# Patient Record
Sex: Female | Born: 1985 | Race: Black or African American | Hispanic: No | Marital: Married | Smoking: Never smoker
Health system: Southern US, Community
[De-identification: ages and names within clinical notes are randomized; demographics above are authoritative.]

## PROBLEM LIST (undated history)

## (undated) ENCOUNTER — Inpatient Hospital Stay: Payer: Self-pay

## (undated) DIAGNOSIS — R87810 Cervical high risk human papillomavirus (HPV) DNA test positive: Secondary | ICD-10-CM

## (undated) DIAGNOSIS — D696 Thrombocytopenia, unspecified: Secondary | ICD-10-CM

## (undated) DIAGNOSIS — D270 Benign neoplasm of right ovary: Secondary | ICD-10-CM

## (undated) DIAGNOSIS — N6019 Diffuse cystic mastopathy of unspecified breast: Secondary | ICD-10-CM

## (undated) DIAGNOSIS — Z8719 Personal history of other diseases of the digestive system: Secondary | ICD-10-CM

## (undated) DIAGNOSIS — O99119 Other diseases of the blood and blood-forming organs and certain disorders involving the immune mechanism complicating pregnancy, unspecified trimester: Secondary | ICD-10-CM

## (undated) HISTORY — PX: TONSILLECTOMY: SUR1361

## (undated) HISTORY — DX: Diffuse cystic mastopathy of unspecified breast: N60.19

## (undated) HISTORY — DX: Personal history of other diseases of the digestive system: Z87.19

---

## 2014-09-20 DIAGNOSIS — D696 Thrombocytopenia, unspecified: Secondary | ICD-10-CM | POA: Insufficient documentation

## 2014-10-31 DIAGNOSIS — O99119 Other diseases of the blood and blood-forming organs and certain disorders involving the immune mechanism complicating pregnancy, unspecified trimester: Secondary | ICD-10-CM

## 2014-12-19 DIAGNOSIS — R87810 Cervical high risk human papillomavirus (HPV) DNA test positive: Secondary | ICD-10-CM

## 2014-12-19 HISTORY — DX: Cervical high risk human papillomavirus (HPV) DNA test positive: R87.810

## 2015-05-15 DIAGNOSIS — A630 Anogenital (venereal) warts: Secondary | ICD-10-CM | POA: Insufficient documentation

## 2015-08-11 DIAGNOSIS — D270 Benign neoplasm of right ovary: Secondary | ICD-10-CM

## 2015-08-11 HISTORY — DX: Benign neoplasm of right ovary: D27.0

## 2015-11-28 DIAGNOSIS — O99810 Abnormal glucose complicating pregnancy: Secondary | ICD-10-CM | POA: Insufficient documentation

## 2016-04-19 NOTE — L&D Delivery Note (Signed)
Delivery Note At 7:22 PM a viable and healthy female - as yet unnamed - was delivered via Vaginal, Spontaneous (Presentation: LOA;  ).  APGAR:8 ,9 ; weight  pending.   Placenta status: spontaneous, intact.  Cord: 3VC with the following complications: none.  Anesthesia:  epidural Episiotomy: None Lacerations: None Est. Blood Loss (mL):  1000, quantitative blood loss pending  Mom to postpartum.  Baby to Couplet care / Skin to Skin.  31yo [id number] at 40+1wks admitted for iol with concern for macrosomia, with a hx of a 10#14oz first baby by vaginal delivery without shoulder dystocia. She was 3cm on arrival and pitocin was started. After 12hrs without cervical change but frequent contractions, AROM for clear fluid. She progressed rapidly to fully dilated and pushed easily, delivering fetal head in an LOA position. Fetal shoulders followed promptly, and the baby was placed on the maternal abdomen. Delayed cord clamping x 60sec, but a large gush of bright red bleeding followed. The cord was quickly doubling clamped and cut and the placenta delivered spontaneously promptly, followed by continued bright red bleeding. Fundal massage and bimanual compression of the LUS resulted in resolved bleeding. After iodine cleaning of uretheral opening, the bladder was drained for about 117ml of clear yellow urine, and thorough examination of the vagina and cervix revealed no lacerations. 0.2mg  of IM methergine was given after confirming blood pressure. Bimanual confirmed small clots in LUS and firm fundus, and bleeding returned to minimal.   Baby to breast currently. Both mom and baby tolerated the procedure well. Will continue to monitor per protocol for continued bleeding  Benjaman Kindler 03/03/2017, 7:44 PM

## 2016-07-06 ENCOUNTER — Ambulatory Visit (INDEPENDENT_AMBULATORY_CARE_PROVIDER_SITE_OTHER): Payer: Managed Care, Other (non HMO) | Admitting: Obstetrics and Gynecology

## 2016-07-06 ENCOUNTER — Encounter: Payer: Self-pay | Admitting: Obstetrics and Gynecology

## 2016-07-06 VITALS — BP 150/84 | HR 76 | Ht 70.0 in | Wt 214.4 lb

## 2016-07-06 DIAGNOSIS — N926 Irregular menstruation, unspecified: Secondary | ICD-10-CM

## 2016-07-06 DIAGNOSIS — O09891 Supervision of other high risk pregnancies, first trimester: Secondary | ICD-10-CM | POA: Diagnosis not present

## 2016-07-06 DIAGNOSIS — O09299 Supervision of pregnancy with other poor reproductive or obstetric history, unspecified trimester: Secondary | ICD-10-CM

## 2016-07-06 LAB — POCT URINE PREGNANCY: Preg Test, Ur: POSITIVE — AB

## 2016-07-06 NOTE — Progress Notes (Signed)
HPI:      Ms. Jessica Cantu is a 31 y.o. [id number] who LMP was Patient's last menstrual period was 05/26/2016 (exact date).  Subjective:   She presents today After missing her menstrual period. She has had home positive pregnancy tests. She recently had her second pregnancy and was breast-feeding. She was not preventing pregnancy. She is taking prenatal vitamins. Her previous pregnancies were relatively uncomplicated. She has a proven pelvis with a previous pregnancy with weight greater than 10 pounds. She reports that she has never been diagnosed as a gestational diabetic.    Hx: The following portions of the patient's history were reviewed and updated as appropriate:              She  has no past medical history on file. She  does not have a problem list on file. She  has a past surgical history that includes Tonsillectomy. Her family history includes Cancer in her paternal grandfather; Diabetes in her maternal grandfather, maternal grandmother, paternal grandfather, and paternal grandmother; Hypertension in her father and maternal grandmother. She  reports that she has never smoked. She has never used smokeless tobacco. She reports that she drinks alcohol. She reports that she does not use drugs. No current outpatient prescriptions on file prior to visit.   No current facility-administered medications on file prior to visit.          Review of Systems:  Review of Systems  Constitutional: Denied constitutional symptoms, night sweats, recent illness, fatigue, fever, insomnia and weight loss.  Eyes: Denied eye symptoms, eye pain, photophobia, vision change and visual disturbance.  Ears/Nose/Throat/Neck: Denied ear, nose, throat or neck symptoms, hearing loss, nasal discharge, sinus congestion and sore throat.  Cardiovascular: Denied cardiovascular symptoms, arrhythmia, chest pain/pressure, edema, exercise intolerance, orthopnea and palpitations.  Respiratory: Denied pulmonary symptoms,  asthma, pleuritic pain, productive sputum, cough, dyspnea and wheezing.  Gastrointestinal: Denied, gastro-esophageal reflux, melena, nausea and vomiting.  Genitourinary: Denied genitourinary symptoms including symptomatic vaginal discharge, pelvic relaxation issues, and urinary complaints.  Musculoskeletal: Denied musculoskeletal symptoms, stiffness, swelling, muscle weakness and myalgia.  Dermatologic: Denied dermatology symptoms, rash and scar.  Neurologic: Denied neurology symptoms, dizziness, headache, neck pain and syncope.  Psychiatric: Denied psychiatric symptoms, anxiety and depression.  Endocrine: Denied endocrine symptoms including hot flashes and night sweats.   Meds:   No current outpatient prescriptions on file prior to visit.   No current facility-administered medications on file prior to visit.     Objective:     Vitals:   07/06/16 1341  BP: (!) 150/84  Pulse: 76                 Pelvic:   Vulva: Normal appearance.  No lesions.  Vagina: No lesions or abnormalities noted.  Support: Normal pelvic support.  Urethra No masses tenderness or scarring.  Meatus Normal size without lesions or prolapse.  Cervix: Normal appearance.  No lesions.  Anus: Normal exam.  No lesions.  Perineum: Normal exam.  No lesions.        Bimanual   Adnexae: No masses.  Non-tender to palpation.  Uterus: Minimal enlargement  Non-tender.  Mobile.  AV.  Adnexae: No masses.  Non-tender to palpation.  Cul-de-sac: Negative for abnormality.  Adnexae: No masses.  Non-tender to palpation.         Pelvimetry   Diagonal: Reached.  Spines: Average.  Sacrum: Concave.  Pubic Arch: Normal.      Assessment:    G3P2002 There are no active problems  to display for this patient.    1. Missed menses   2. Short interval between pregnancies affecting pregnancy in first trimester, antepartum   3. History of macrosomia in infant in prior pregnancy, currently pregnant     Dates are important  because of the breast-feeding and previous history of macrosomia.   Plan:            Prenatal Plan 1.  The patient was given prenatal literature. 2.  She was continued on prenatal vitamins. 3.  A prenatal lab panel was ordered or drawn. 4.  An ultrasound was ordered to better determine an EDC. 5.  Early 1 hour GCT (patient desires alternate form of sugar load) 6.  Pap done  Orders Orders Placed This Encounter  Procedures  . US OB Transvaginal  . POCT urine pregnancy     Meds ordered this encounter  Medications  . Prenatal Vit-Fe Fumarate-FA (PRENATAL MULTIVITAMIN) TABS tablet    Sig: Take 1 tablet by mouth daily at 12 noon.        F/U  Return in about 5 weeks (around 08/10/2016).  Finis Bud, M.D. 07/06/2016 2:42 PM

## 2016-07-08 LAB — PAP IG AND HPV HIGH-RISK
HPV, HIGH-RISK: POSITIVE — AB
PAP Smear Comment: 0

## 2016-07-12 ENCOUNTER — Telehealth: Payer: Self-pay

## 2016-07-12 NOTE — Telephone Encounter (Signed)
-----   Message from Harlin Heys, MD sent at 07/12/2016  8:36 AM EDT ----- NEG - cytology:  POS - HPV - discuss at next visit

## 2016-07-12 NOTE — Telephone Encounter (Signed)
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

## 2016-07-13 NOTE — Telephone Encounter (Signed)
Pt has appointment with provider 07/26/16.

## 2016-07-26 ENCOUNTER — Ambulatory Visit (INDEPENDENT_AMBULATORY_CARE_PROVIDER_SITE_OTHER): Payer: Managed Care, Other (non HMO)

## 2016-07-26 ENCOUNTER — Ambulatory Visit (INDEPENDENT_AMBULATORY_CARE_PROVIDER_SITE_OTHER): Payer: Managed Care, Other (non HMO) | Admitting: Obstetrics and Gynecology

## 2016-07-26 VITALS — BP 137/83 | HR 79 | Ht 70.0 in | Wt 215.6 lb

## 2016-07-26 DIAGNOSIS — Z3481 Encounter for supervision of other normal pregnancy, first trimester: Secondary | ICD-10-CM

## 2016-07-26 DIAGNOSIS — N926 Irregular menstruation, unspecified: Secondary | ICD-10-CM

## 2016-07-26 DIAGNOSIS — R638 Other symptoms and signs concerning food and fluid intake: Secondary | ICD-10-CM

## 2016-07-26 DIAGNOSIS — Z1389 Encounter for screening for other disorder: Secondary | ICD-10-CM

## 2016-07-26 DIAGNOSIS — Z113 Encounter for screening for infections with a predominantly sexual mode of transmission: Secondary | ICD-10-CM

## 2016-07-26 NOTE — Progress Notes (Signed)
Milus Glazier presents for NOB nurse interview visit. Pregnancy confirmation done 07/06/2016 by Dr. Amalia Hailey. UPT: positive. Ultrasound done 07/26/2016 results: viable fetus, 8.5wks which is consistent with LMP: 05/26/2016. Also noted dermoid cyst that has been seen on ultrasound before.  G-3.  P-2002. Pregnancy education material explained and given. No cats in the home. NOB labs ordered. TSH/HbgA1c due to Increased BMI. HIV labs and Drug screen were explained optional and she did not decline. Drug screen ordered. PNV encouraged. Genetic screening options discussed. Genetic testing: Declined.  Pt may discuss with provider if indicated.  Pt. To follow up with provider 08/17/2016 with Dr. Marcelline Mates  for NOB physical.  All questions answered.

## 2016-07-26 NOTE — Patient Instructions (Signed)
Pregnancy and Zika Virus Disease Zika virus disease, or Zika, is an illness that can spread to people from mosquitoes that carry the virus. It may also spread from person to person through infected body fluids. Zika first occurred in [place], but recently it has spread to new areas. The virus occurs in tropical climates. The location of Zika continues to change. Most people who become infected with Zika virus do not develop serious illness. However, Zika may cause birth defects in an unborn baby whose mother is infected with the virus. It may also increase the risk of miscarriage. What are the symptoms of Zika virus disease? In many cases, people who have been infected with Zika virus do not develop any symptoms. If symptoms appear, they usually start about a week after the person is infected. Symptoms are usually mild. They may include:  Fever.  Rash.  Red eyes.  Joint pain. How does Zika virus disease spread? The main way that [place] virus spreads is through the bite of a certain type of mosquito. Unlike most types of mosquitos, which bite only at night, the type of mosquito that carries Zika virus bites both at night and during the day. Zika virus can also spread through sexual contact, through a blood transfusion, and from a mother to her baby before or during birth. Once you have had Zika virus disease, it is unlikely that you will get it again. Can I pass Zika to my baby during pregnancy? Yes, Zika can pass from a mother to her baby before or during birth. What problems can Zika cause for my baby? A woman who is infected with Zika virus while pregnant is at risk of having her baby born with a condition in which the brain or head is smaller than expected (microcephaly). Babies who have microcephaly can have developmental delays, seizures, hearing problems, and vision problems. Having Zika virus disease during pregnancy can also increase the risk of miscarriage. How can Zika virus disease be  prevented? There is no vaccine to prevent Zika. The best way to prevent the disease is to avoid infected mosquitoes and avoid exposure to body fluids that can spread the virus. Avoid any possible exposure to [place] by taking the following precautions. For women and their sex partners:  Avoid traveling to high-risk areas. The locations where [place] is being reported change often. To identify high-risk areas, check the CDC travel website: CreditChaos.com.ee  If you or your sex partner must travel to a high-risk area, talk with a health care provider before and after traveling.  Take all precautions to avoid mosquito bites if you live in, or travel to, any of the high-risk areas. Insect repellents are safe to use during pregnancy.  Ask your health care provider when it is safe to have sexual contact. For women:  If you are pregnant or trying to become pregnant, avoid sexual contact with persons who may have been exposed to [place] virus, persons who have possible symptoms of Zika, or persons whose history you are unsure about. If you choose to have sexual contact with someone who may have been exposed to [place] virus, use condoms correctly during the entire duration of sexual activity, every time. Do not share sexual devices, as you may be exposed to body fluids.  Ask your health care provider about when it is safe to attempt pregnancy after a possible exposure to [place] virus. What steps should I take to avoid mosquito bites? Take these steps to avoid mosquito bites when you are  in a high-risk area:  Wear loose clothing that covers your arms and legs.  Limit your outdoor activities.  Do not open windows unless they have window screens.  Sleep under mosquito nets.  Use insect repellent. The best insect repellents have:  DEET, picaridin, oil of lemon eucalyptus (OLE), or [id number] in them.  Higher amounts of an active ingredient in them.  Remember that insect repellents are safe to use  during pregnancy.  Do not use OLE on children who are younger than 59 years of age. Do not use insect repellent on babies who are younger than 57 months of age.  Cover your child's stroller with mosquito netting. Make sure the netting fits snugly and that any loose netting does not cover your child's mouth or nose. Do not use a blanket as a mosquito-protection cover.  Do not apply insect repellent underneath clothing.  If you are using sunscreen, apply the sunscreen before applying the insect repellent.  Treat clothing with permethrin. Do not apply permethrin directly to your skin. Follow label directions for safe use.  Get rid of standing water, where mosquitoes may reproduce. Standing water is often found in items such as buckets, bowls, animal food dishes, and flowerpots. When you return from traveling to any high-risk area, continue taking actions to protect yourself against mosquito bites for 3 weeks, even if you show no signs of illness. This will prevent spreading Zika virus to uninfected mosquitoes. What should I know about the sexual transmission of Zika? People can spread Zika to their sexual partners during vaginal, anal, or oral sex, or by sharing sexual devices. Many people with [place] do not develop symptoms, so a person could spread the disease without knowing that they are infected. The greatest risk is to women who are pregnant or who may become pregnant. Zika virus can live longer in semen than it can live in blood. Couples can prevent sexual transmission of the virus by:  Using condoms correctly during the entire duration of sexual activity, every time. This includes vaginal, anal, and oral sex.  Not sharing sexual devices. Sharing increases your risk of being exposed to body fluid from another person.  Avoiding all sexual activity until your health care provider says it is safe. Should I be tested for Zika virus? A sample of your blood can be tested for Zika virus. A pregnant  woman should be tested if she may have been exposed to the virus or if she has symptoms of Zika. She may also have additional tests done during her pregnancy, such ultrasound testing. Talk with your health care provider about which tests are recommended. This information is not intended to replace advice given to you by your health care provider. Make sure you discuss any questions you have with your health care provider. Document Released: 12/25/2014 Document Revised: 09/11/2015 Document Reviewed: 12/18/2014 Elsevier Interactive Patient Education  2017 [place] and Medications in Pregnancy  Cold/Flu:  Sudafed for congestion- Robitussin (plain) for cough- Tylenol for discomfort.  Please follow the directions on the label.  Try not to take any more than needed.  OTC Saline nasal spray and air humidifier or cool-mist  Vaporizer to sooth nasal irritation and to loosen congestion.  It is also important to increase intake of non carbonated fluids, especially if you have a fever.  Constipation:  Colace-2 capsules at bedtime; Metamucil- follow directions on label; Senokot- 1 tablet at bedtime.  Any one of these medications can be used.  It is also  very important to increase fluids and fruits along with regular exercise.  If problem persists please call the office.  Diarrhea:  Kaopectate as directed on the label.  Eat a bland diet and increase fluids.  Avoid highly seasoned foods.  Headache:  Tylenol 1 or 2 tablets every 3-4 hours as needed  Indigestion:  Maalox, Mylanta, Tums or Rolaids- as directed on label.  Also try to eat small meals and avoid fatty, greasy or spicy foods.  Nausea with or without Vomiting:  Nausea in pregnancy is caused by increased levels of hormones in the body which influence the digestive system and cause irritation when stomach acids accumulate.  Symptoms usually subside after 1st trimester of pregnancy.  Try the following: 1. Keep saltines, graham crackers  or dry toast by your bed to eat upon awakening. 2. Don't let your stomach get empty.  Try to eat 5-6 small meals per day instead of 3 large ones. 3. Avoid greasy fatty or highly seasoned foods.  4. Take OTC Unisom 1 tablet at bed time along with OTC Vitamin B6 25-50 mg 3 times per day.    If nausea continues with vomiting and you are unable to keep down food and fluids you may need a prescription medication.  Please notify your provider.   Sore throat:  Chloraseptic spray, throat lozenges and or plain Tylenol.  Vaginal Yeast Infection:  OTC Monistat for 7 days as directed on label.  If symptoms do not resolve within a week notify provider.  If any of the above problems do not subside with recommended treatment please call the office for further assistance.   Do not take Aspirin, Advil, Motrin or Ibuprofen.  * * OTC= Over the counter Hyperemesis Gravidarum Hyperemesis gravidarum is a severe form of nausea and vomiting that happens during pregnancy. Hyperemesis is worse than morning sickness. It may cause you to have nausea or vomiting all day for many days. It may keep you from eating and drinking enough food and liquids. Hyperemesis usually occurs during the first half (the first 20 weeks) of pregnancy. It often goes away once a woman is in her second half of pregnancy. However, sometimes hyperemesis continues through an entire pregnancy. What are the causes? The cause of this condition is not known. It may be related to changes in chemicals (hormones) in the body during pregnancy, such as the high level of pregnancy hormone (human chorionic gonadotropin) or the increase in the female sex hormone (estrogen). What are the signs or symptoms? Symptoms of this condition include:  Severe nausea and vomiting.  Nausea that does not go away.  Vomiting that does not allow you to keep any food down.  Weight loss.  Body fluid loss (dehydration).  Having no desire to eat, or not liking food that you  have previously enjoyed. How is this diagnosed? This condition may be diagnosed based on:  A physical exam.  Your medical history.  Your symptoms.  Blood tests.  Urine tests. How is this treated? This condition may be managed with medicine. If medicines to do not help relieve nausea and vomiting, you may need to receive fluids through an IV tube at the hospital. Follow these instructions at home:  Take over-the-counter and prescription medicines only as told by your health care provider.  Avoid iron pills and multivitamins that contain iron for the first 3-4 months of pregnancy. If you take prescription iron pills, do not stop taking them unless your health care provider approves.  Take the  following actions to help prevent nausea and vomiting:  In the morning, before getting out of bed, try eating a couple of dry crackers or a piece of toast.  Avoid foods and smells that upset your stomach. Fatty and spicy foods may make nausea worse.  Eat 5-6 small meals a day.  Do not drink fluids while eating meals. Drink between meals.  Eat or suck on things that have ginger in them. Ginger can help relieve nausea.  Avoid food preparation. The smell of food can spoil your appetite or trigger nausea.  Follow instructions from your health care provider about eating or drinking restrictions.  For snacks, eat high-protein foods, such as cheese.  Keep all follow-up and pre-birth (prenatal) visits as told by your health care provider. This is important. Contact a health care provider if:  You have pain in your abdomen.  You have a severe headache.  You have vision problems.  You are losing weight. Get help right away if:  You cannot drink fluids without vomiting.  You vomit blood.  You have constant nausea and vomiting.  You are very weak.  You are very thirsty.  You feel dizzy.  You faint.  You have a fever or other symptoms that last for more than 2-3 days.  You  have a fever and your symptoms suddenly get worse. Summary  Hyperemesis gravidarum is a severe form of nausea and vomiting that happens during pregnancy.  Making some changes to your eating habits may help relieve nausea and vomiting.  This condition may be managed with medicine.  If medicines to do not help relieve nausea and vomiting, you may need to receive fluids through an IV tube at the hospital. This information is not intended to replace advice given to you by your health care provider. Make sure you discuss any questions you have with your health care provider. Document Released: 04/05/2005 Document Revised: 12/03/2015 Document Reviewed: 12/03/2015 Elsevier Interactive Patient Education  2017 [place] of Pregnancy The first trimester of pregnancy is from week 1 until the end of week 13 (months 1 through 3). During this time, your baby will begin to develop inside you. At 6-8 weeks, the eyes and face are formed, and the heartbeat can be seen on ultrasound. At the end of 12 weeks, all the baby's organs are formed. Prenatal care is all the medical care you receive before the birth of your baby. Make sure you get good prenatal care and follow all of your doctor's instructions. Follow these instructions at home: Medicines   Take over-the-counter and prescription medicines only as told by your doctor. Some medicines are safe and some medicines are not safe during pregnancy.  Take a prenatal vitamin that contains at least 600 micrograms (mcg) of folic acid.  If you have trouble pooping (constipation), take medicine that will make your stool soft (stool softener) if your doctor approves. Eating and drinking   Eat regular, healthy meals.  Your doctor will tell you the amount of weight gain that is right for you.  Avoid raw meat and uncooked cheese.  If you feel sick to your stomach (nauseous) or throw up (vomit):  Eat 4 or 5 small meals a day instead of 3 large  meals.  Try eating a few soda crackers.  Drink liquids between meals instead of during meals.  To prevent constipation:  Eat foods that are high in fiber, like fresh fruits and vegetables, whole grains, and beans.  Drink enough fluids to  keep your pee (urine) clear or pale yellow. Activity   Exercise only as told by your doctor. Stop exercising if you have cramps or pain in your lower belly (abdomen) or low back.  Do not exercise if it is too hot, too humid, or if you are in a place of great height (high altitude).  Try to avoid standing for long periods of time. Move your legs often if you must stand in one place for a long time.  Avoid heavy lifting.  Wear low-heeled shoes. Sit and stand up straight.  You can have sex unless your doctor tells you not to. Relieving pain and discomfort   Wear a good support bra if your breasts are sore.  Take warm water baths (sitz baths) to soothe pain or discomfort caused by hemorrhoids. Use hemorrhoid cream if your doctor says it is okay.  Rest with your legs raised if you have leg cramps or low back pain.  If you have puffy, bulging veins (varicose veins) in your legs:  Wear support hose or compression stockings as told by your doctor.  Raise (elevate) your feet for 15 minutes, 3-4 times a day.  Limit salt in your food. Prenatal care   Schedule your prenatal visits by the twelfth week of pregnancy.  Write down your questions. Take them to your prenatal visits.  Keep all your prenatal visits as told by your doctor. This is important. Safety   Wear your seat belt at all times when driving.  Make a list of emergency phone numbers. The list should include numbers for family, friends, the hospital, and police and fire departments. General instructions   Ask your doctor for a referral to a local prenatal class. Begin classes no later than at the start of month 6 of your pregnancy.  Ask for help if you need counseling or if you  need help with nutrition. Your doctor can give you advice or tell you where to go for help.  Do not use hot tubs, steam rooms, or saunas.  Do not douche or use tampons or scented sanitary pads.  Do not cross your legs for long periods of time.  Avoid all herbs and alcohol. Avoid drugs that are not approved by your doctor.  Do not use any tobacco products, including cigarettes, chewing tobacco, and electronic cigarettes. If you need help quitting, ask your doctor. You may get counseling or other support to help you quit.  Avoid cat litter boxes and soil used by cats. These carry germs that can cause birth defects in the baby and can cause a loss of your baby (miscarriage) or stillbirth.  Visit your dentist. At home, brush your teeth with a soft toothbrush. Be gentle when you floss. Contact a doctor if:  You are dizzy.  You have mild cramps or pressure in your lower belly.  You have a nagging pain in your belly area.  You continue to feel sick to your stomach, you throw up, or you have watery poop (diarrhea).  You have a bad smelling fluid coming from your vagina.  You have pain when you pee (urinate).  You have increased puffiness (swelling) in your face, hands, legs, or ankles. Get help right away if:  You have a fever.  You are leaking fluid from your vagina.  You have spotting or bleeding from your vagina.  You have very bad belly cramping or pain.  You gain or lose weight rapidly.  You throw up blood. It may look like coffee  grounds.  You are around people who have [place] measles, fifth disease, or chickenpox.  You have a very bad headache.  You have shortness of breath.  You have any kind of trauma, such as from a fall or a car accident. Summary  The first trimester of pregnancy is from week 1 until the end of week 13 (months 1 through 3).  To take care of yourself and your unborn baby, you will need to eat healthy meals, take medicines only if your doctor  tells you to do so, and do activities that are safe for you and your baby.  Keep all follow-up visits as told by your doctor. This is important as your doctor will have to ensure that your baby is healthy and growing well. This information is not intended to replace advice given to you by your health care provider. Make sure you discuss any questions you have with your health care provider. Document Released: 09/22/2007 Document Revised: 04/13/2016 Document Reviewed: 04/13/2016 Elsevier Interactive Patient Education  2017 [place]. Commonly Asked Questions During Pregnancy  Cats: A parasite can be excreted in cat feces.  To avoid exposure you need to have another person empty the little box.  If you must empty the litter box you will need to wear gloves.  Wash your hands after handling your cat.  This parasite can also be found in raw or undercooked meat so this should also be avoided.  Colds, Sore Throats, Flu: Please check your medication sheet to see what you can take for symptoms.  If your symptoms are unrelieved by these medications please call the office.  Dental Work: Most any dental work [employer] recommends is permitted.  X-rays should only be taken during the first trimester if absolutely necessary.  Your abdomen should be shielded with a lead apron during all x-rays.  Please notify your provider prior to receiving any x-rays.  Novocaine is fine; gas is not recommended.  If your dentist requires a note from [place] prior to dental work please call the office and we will provide one for you.  Exercise: Exercise is an important part of staying healthy during your pregnancy.  You may continue most exercises you were accustomed to prior to pregnancy.  Later in your pregnancy you will most likely notice you have difficulty with activities requiring balance like riding a bicycle.  It is important that you listen to your body and avoid activities that put you at a higher risk of falling.   Adequate rest and staying well hydrated are a must!  If you have questions about the safety of specific activities ask your provider.    Exposure to Children with illness: Try to avoid obvious exposure; report any symptoms to [place] when noted,  If you have chicken pos, red measles or mumps, you should be immune to these diseases.   Please do not take any vaccines while pregnant unless you have checked with your OB provider.  Fetal Movement: After 28 weeks we recommend you do "kick counts" twice daily.  Lie or sit down in a calm quiet environment and count your baby movements "kicks".  You should feel your baby at least 10 times per hour.  If you have not felt 10 kicks within the first hour get up, walk around and have something sweet to eat or drink then repeat for an additional hour.  If count remains less than 10 per hour notify your provider.  Fumigating: Follow your pest control agent's advice as  to how long to stay out of your home.  Ventilate the area well before re-entering.  Hemorrhoids:   Most over-the-counter preparations can be used during pregnancy.  Check your medication to see what is safe to use.  It is important to use a stool softener or fiber in your diet and to drink lots of liquids.  If hemorrhoids seem to be getting worse please call the office.   Hot Tubs:  Hot tubs Jacuzzis and saunas are not recommended while pregnant.  These increase your internal body temperature and should be avoided.  Intercourse:  Sexual intercourse is safe during pregnancy as long as you are comfortable, unless otherwise advised by your provider.  Spotting may occur after intercourse; report any bright red bleeding that is heavier than spotting.  Labor:  If you know that you are in labor, please go to the hospital.  If you are unsure, please call the office and let us help you decide what to do.  Lifting, straining, etc:  If your job requires heavy lifting or straining please check with your provider for  any limitations.  Generally, you should not lift items heavier than that you can lift simply with your hands and arms (no back muscles)  Painting:  Paint fumes do not harm your pregnancy, but may make you ill and should be avoided if possible.  Latex or water based paints have less odor than oils.  Use adequate ventilation while painting.  Permanents & Hair Color:  Chemicals in hair dyes are not recommended as they cause increase hair dryness which can increase hair loss during pregnancy.  " Highlighting" and permanents are allowed.  Dye may be absorbed differently and permanents may not hold as well during pregnancy.  Sunbathing:  Use a sunscreen, as skin burns easily during pregnancy.  Drink plenty of fluids; avoid over heating.  Tanning Beds:  Because their possible side effects are still unknown, tanning beds are not recommended.  Ultrasound Scans:  Routine ultrasounds are performed at approximately 20 weeks.  You will be able to see your baby's general anatomy an if you would like to know the gender this can usually be determined as well.  If it is questionable when you conceived you may also receive an ultrasound early in your pregnancy for dating purposes.  Otherwise ultrasound exams are not routinely performed unless there is a medical necessity.  Although you can request a scan we ask that you pay for it when conducted because insurance does not cover " patient request" scans.  Work: If your pregnancy proceeds without complications you may work until your due date, unless your physician or employer advises otherwise.  Round Ligament Pain/Pelvic Discomfort:  Sharp, shooting pains not associated with bleeding are fairly common, usually occurring in the second trimester of pregnancy.  They tend to be worse when standing up or when you remain standing for long periods of time.  These are the result of pressure of certain pelvic ligaments called "round ligaments".  Rest, Tylenol and heat seem to be  the most effective relief.  As the womb and fetus grow, they rise out of the pelvis and the discomfort improves.  Please notify the office if your pain seems different than that described.  It may represent a more serious condition.

## 2016-07-26 NOTE — Progress Notes (Signed)
I have reviewed the record and concur with patient management and plan.  Rubie Maid, MD [facility]

## 2016-07-27 LAB — RH TYPE: Rh Factor: POSITIVE

## 2016-07-28 LAB — MONITOR DRUG PROFILE 14(MW)
Amphetamine Scrn, Ur: NEGATIVE ng/mL
BARBITURATE SCREEN URINE: NEGATIVE ng/mL
BENZODIAZEPINE SCREEN, URINE: NEGATIVE ng/mL
Buprenorphine, Urine: NEGATIVE ng/mL
CANNABINOIDS UR QL SCN: NEGATIVE ng/mL
COCAINE(METAB.)SCREEN, URINE: NEGATIVE ng/mL
CREATININE(CRT), U: 227.2 mg/dL (ref 20.0–300.0)
Fentanyl, Urine: NEGATIVE pg/mL
MEPERIDINE SCREEN, URINE: NEGATIVE ng/mL
METHADONE SCREEN, URINE: NEGATIVE ng/mL
OXYCODONE+OXYMORPHONE UR QL SCN: NEGATIVE ng/mL
Opiate Scrn, Ur: NEGATIVE ng/mL
PH UR, DRUG SCRN: 6.6 (ref 4.5–8.9)
Phencyclidine Qn, Ur: NEGATIVE ng/mL
Propoxyphene Scrn, Ur: NEGATIVE ng/mL
SPECIFIC GRAVITY: 1.022
Tramadol Screen, Urine: NEGATIVE ng/mL

## 2016-07-28 LAB — ABO

## 2016-07-28 LAB — CBC WITH DIFFERENTIAL/PLATELET
BASOS ABS: 0 10*3/uL (ref 0.0–0.2)
Basos: 0 %
EOS (ABSOLUTE): 0 10*3/uL (ref 0.0–0.4)
Eos: 1 %
HEMOGLOBIN: 13.3 g/dL (ref 11.1–15.9)
Hematocrit: 39.8 % (ref 34.0–46.6)
IMMATURE GRANS (ABS): 0 10*3/uL (ref 0.0–0.1)
IMMATURE GRANULOCYTES: 0 %
Lymphocytes Absolute: 1.7 10*3/uL (ref 0.7–3.1)
Lymphs: 33 %
MCH: 27 pg (ref 26.6–33.0)
MCHC: 33.4 g/dL (ref 31.5–35.7)
MCV: 81 fL (ref 79–97)
Monocytes Absolute: 0.3 10*3/uL (ref 0.1–0.9)
Monocytes: 5 %
NEUTROS ABS: 3.2 10*3/uL (ref 1.4–7.0)
NEUTROS PCT: 61 %
PLATELETS: 206 10*3/uL (ref 150–379)
RBC: 4.92 x10E6/uL (ref 3.77–5.28)
RDW: 14.8 % (ref 12.3–15.4)
WBC: 5.3 10*3/uL (ref 3.4–10.8)

## 2016-07-28 LAB — URINALYSIS, ROUTINE W REFLEX MICROSCOPIC
BILIRUBIN UA: NEGATIVE
Glucose, UA: NEGATIVE
Ketones, UA: NEGATIVE
Leukocytes, UA: NEGATIVE
Nitrite, UA: NEGATIVE
RBC UA: NEGATIVE
Specific Gravity, UA: 1.023 (ref 1.005–1.030)
UUROB: 0.2 mg/dL (ref 0.2–1.0)
pH, UA: 6.5 (ref 5.0–7.5)

## 2016-07-28 LAB — GC/CHLAMYDIA PROBE AMP
CHLAMYDIA, DNA PROBE: NEGATIVE
NEISSERIA GONORRHOEAE BY PCR: NEGATIVE

## 2016-07-28 LAB — ANTIBODY SCREEN: ANTIBODY SCREEN: NEGATIVE

## 2016-07-28 LAB — HEPATITIS B SURFACE ANTIGEN: Hepatitis B Surface Ag: NEGATIVE

## 2016-07-28 LAB — RPR: RPR: NONREACTIVE

## 2016-07-28 LAB — SICKLE CELL SCREEN: Sickle Cell Screen: NEGATIVE

## 2016-07-28 LAB — NICOTINE SCREEN, URINE: Cotinine Ql Scrn, Ur: NEGATIVE ng/mL

## 2016-07-28 LAB — OB RESULTS CONSOLE VARICELLA ZOSTER ANTIBODY, IGG: Varicella: IMMUNE

## 2016-07-28 LAB — HEMOGLOBIN A1C
Est. average glucose Bld gHb Est-mCnc: 103 mg/dL
HEMOGLOBIN A1C: 5.2 % (ref 4.8–5.6)

## 2016-07-28 LAB — HIV ANTIBODY (ROUTINE TESTING W REFLEX): HIV SCREEN 4TH GENERATION: NONREACTIVE

## 2016-07-28 LAB — VARICELLA ZOSTER ANTIBODY, IGG: VARICELLA: 2877 {index} (ref >165)

## 2016-07-28 LAB — RUBELLA SCREEN: RUBELLA: 9.53 {index} (ref >0.99)

## 2016-07-28 LAB — TSH: TSH: 1.16 u[IU]/mL (ref 0.450–4.500)

## 2016-07-29 LAB — URINE CULTURE, OB REFLEX

## 2016-07-29 LAB — CULTURE, OB URINE

## 2016-08-16 ENCOUNTER — Telehealth: Payer: Self-pay | Admitting: Obstetrics and Gynecology

## 2016-08-16 ENCOUNTER — Telehealth: Payer: Self-pay

## 2016-08-16 ENCOUNTER — Encounter: Payer: Self-pay | Admitting: Obstetrics and Gynecology

## 2016-08-16 NOTE — Telephone Encounter (Signed)
Called pt as I was told that she refused to reschedule her appointment until she "spoke with a nurse or someone" Pt states that she does not understand why she would need a pelvic exam at her upcoming NOB PE if she just had one at her confirmation visit. Advised pt that she would not need to repeat PAP and pelvic exam as she had recently had that performed. Also advised pt that at anytime she is uncomfortable with an exam she may decline. Pt also states that she was confused as to why she had to reschedule her appointment, advised pt that we are a smaller practice than what she is used too and there for if a provider has to leave for a delivery or personal time (such as appointment) we have no control over that. Pt agrees to make appointment at this time, call transferred to K. Cox for scheduling.

## 2016-08-16 NOTE — Telephone Encounter (Signed)
Not really sure why this was sent to me.  We all know the difference between a confirmation appointment and an OB visit.  Dr. Amalia Hailey may have performed a more detailed confirmation appointment so some of the things I usually would do, I would not have to during her visit, but this is to start her prenatal care and review any labs and answer any questions she may have.  It would just be a routine Ob appointment.

## 2016-08-16 NOTE — Telephone Encounter (Signed)
Patient called wanting to know why she has to have a new OB PE when she just had an appointment with Dr Amalia Hailey for the same thing. She is very upset and states she will not be making another appointment until someone explains to her why she needs this appointment. Thanks

## 2016-08-17 ENCOUNTER — Encounter: Payer: Managed Care, Other (non HMO) | Admitting: Obstetrics and Gynecology

## 2016-08-17 NOTE — Telephone Encounter (Signed)
Spoke with Jessica Cantu- unaware this was already taken care of on 08/16/16.

## 2016-08-18 ENCOUNTER — Encounter: Payer: Managed Care, Other (non HMO) | Admitting: Obstetrics and Gynecology

## 2016-08-19 ENCOUNTER — Ambulatory Visit (INDEPENDENT_AMBULATORY_CARE_PROVIDER_SITE_OTHER): Payer: Managed Care, Other (non HMO) | Admitting: Obstetrics and Gynecology

## 2016-08-19 VITALS — BP 148/88 | HR 80 | Wt 220.7 lb

## 2016-08-19 DIAGNOSIS — E669 Obesity, unspecified: Secondary | ICD-10-CM

## 2016-08-19 DIAGNOSIS — R87618 Other abnormal cytological findings on specimens from cervix uteri: Secondary | ICD-10-CM

## 2016-08-19 DIAGNOSIS — E66811 Obesity, class 1: Secondary | ICD-10-CM

## 2016-08-19 DIAGNOSIS — O09891 Supervision of other high risk pregnancies, first trimester: Secondary | ICD-10-CM

## 2016-08-19 DIAGNOSIS — Z8759 Personal history of other complications of pregnancy, childbirth and the puerperium: Secondary | ICD-10-CM

## 2016-08-19 DIAGNOSIS — R030 Elevated blood-pressure reading, without diagnosis of hypertension: Secondary | ICD-10-CM

## 2016-08-19 DIAGNOSIS — Z3482 Encounter for supervision of other normal pregnancy, second trimester: Secondary | ICD-10-CM

## 2016-08-19 DIAGNOSIS — R8789 Other abnormal findings in specimens from female genital organs: Secondary | ICD-10-CM

## 2016-08-19 DIAGNOSIS — Z862 Personal history of diseases of the blood and blood-forming organs and certain disorders involving the immune mechanism: Secondary | ICD-10-CM

## 2016-08-19 DIAGNOSIS — O09299 Supervision of pregnancy with other poor reproductive or obstetric history, unspecified trimester: Secondary | ICD-10-CM

## 2016-08-19 LAB — POCT URINALYSIS DIPSTICK
BILIRUBIN UA: NEGATIVE
Blood, UA: NEGATIVE
Glucose, UA: NEGATIVE
KETONES UA: NEGATIVE
Nitrite, UA: NEGATIVE
SPEC GRAV UA: 1.01 (ref 1.010–1.025)
Urobilinogen, UA: 0.2 E.U./dL
pH, UA: 7.5 (ref 5.0–8.0)

## 2016-08-19 NOTE — Patient Instructions (Addendum)
Hypertension During Pregnancy °Hypertension, commonly called high blood pressure, is when the force of blood pumping through your arteries is too strong. Arteries are blood vessels that carry blood from the heart throughout the body. Hypertension during pregnancy can cause problems for you and your baby. Your baby may be born early (prematurely) or may not weigh as much as he or she should at birth. Very bad cases of hypertension during pregnancy can be life-threatening. °Different types of hypertension can occur during pregnancy. These include: °· Chronic hypertension. This happens when: °¨ You have hypertension before pregnancy and it continues during pregnancy. °¨ You develop hypertension before you are [redacted] weeks pregnant, and it continues during pregnancy. °· Gestational hypertension. This is hypertension that develops after the 20th week of pregnancy. °· Preeclampsia, also called toxemia of pregnancy. This is a very serious type of hypertension that develops only during pregnancy. It affects the whole body, and it can be very dangerous for you and your baby. °Gestational hypertension and preeclampsia usually go away within 6 weeks after your baby is born. Women who have hypertension during pregnancy have a greater chance of developing hypertension later in life or during future pregnancies. °What are the causes? °The exact cause of hypertension is not known. °What increases the risk? °There are certain factors that make it more likely for you to develop hypertension during pregnancy. These include: °· Having hypertension during a previous pregnancy or prior to pregnancy. °· Being overweight. °· Being older than age 40. °· Being pregnant for the first time or being pregnant with more than one baby. °· Becoming pregnant using fertilization methods such as IVF (in vitro fertilization). °· Having diabetes, kidney problems, or systemic lupus erythematosus. °· Having a family history of hypertension. °What are the  signs or symptoms? °Chronic hypertension and gestational hypertension rarely cause symptoms. Preeclampsia causes symptoms, which may include: °· Increased protein in your urine. Your health care provider will check for this at every visit before you give birth (prenatal visit). °· Severe headaches. °· Sudden weight gain. °· Swelling of the hands, face, legs, and feet. °· Nausea and vomiting. °· Vision problems, such as blurred or double vision. °· Numbness in the face, arms, legs, and feet. °· Dizziness. °· Slurred speech. °· Sensitivity to bright lights. °· Abdominal pain. °· Convulsions. °How is this diagnosed? °You may be diagnosed with hypertension during a routine prenatal exam. At each prenatal visit, you may: °· Have a urine test to check for high amounts of protein in your urine. °· Have your blood pressure checked. A blood pressure reading is recorded as two numbers, such as "120 over 80" (or 120/80). The first ("top") number is called the systolic pressure. It is a measure of the pressure in your arteries when your heart beats. The second ("bottom") number is called the diastolic pressure. It is a measure of the pressure in your arteries as your heart relaxes between beats. Blood pressure is measured in a unit called mm Hg. A normal blood pressure reading is: °¨ Systolic: below 120. °¨ Diastolic: below 80. °The type of hypertension that you are diagnosed with depends on your test results and when your symptoms developed. °· Chronic hypertension is usually diagnosed before 20 weeks of pregnancy. °· Gestational hypertension is usually diagnosed after 20 weeks of pregnancy. °· Hypertension with high amounts of protein in the urine is diagnosed as preeclampsia. °· Blood pressure measurements that stay above 160 systolic, or above 110 diastolic, are signs of severe preeclampsia. °  How is this treated? Treatment for hypertension during pregnancy varies depending on the type of hypertension you have and how  serious it is.  If you take medicines called ACE inhibitors to treat chronic hypertension, you may need to switch medicines. ACE inhibitors should not be taken during pregnancy.  If you have gestational hypertension, you may need to take blood pressure medicine.  If you are at risk for preeclampsia, your health care provider may recommend that you take a low-dose aspirin every day to prevent high blood pressure during your pregnancy.  If you have severe preeclampsia, you may need to be hospitalized so you and your baby can be monitored closely. You may also need to take medicine (magnesium sulfate) to prevent seizures and to lower blood pressure. This medicine may be given as an injection or through an IV tube.  In some cases, if your condition gets worse, you may need to deliver your baby early. Follow these instructions at home: Eating and drinking   Drink enough fluid to keep your urine clear or pale yellow.  Eat a healthy diet that is low in salt (sodium). Do not add salt to your food. Check food labels to see how much sodium a food or beverage contains. Lifestyle   Do not use any products that contain nicotine or tobacco, such as cigarettes and e-cigarettes. If you need help quitting, ask your health care provider.  Do not use alcohol.  Avoid caffeine.  Avoid stress as much as possible. Rest and get plenty of sleep. General instructions   Take over-the-counter and prescription medicines only as told by your health care provider.  While lying down, lie on your left side. This keeps pressure off your baby.  While sitting or lying down, raise (elevate) your feet. Try putting some pillows under your lower legs.  Exercise regularly. Ask your health care provider what kinds of exercise are best for you.  Keep all prenatal and follow-up visits as told by your health care provider. This is important. Contact a health care provider if:  You have symptoms that your health care  provider told you may require more treatment or monitoring, such as:  Fever.  Vomiting.  Headache. Get help right away if:  You have severe abdominal pain or vomiting that does not get better with treatment.  You suddenly develop swelling in your hands, ankles, or face.  You gain 4 lbs (1.8 kg) or more in 1 week.  You develop vaginal bleeding, or you have blood in your urine.  You do not feel your baby moving as much as usual.  You have blurred or double vision.  You have muscle twitching or sudden tightening (spasms).  You have shortness of breath.  Your lips or fingernails turn blue. This information is not intended to replace advice given to you by your health care provider. Make sure you discuss any questions you have with your health care provider. Document Released: 12/22/2010 Document Revised: 10/24/2015 Document Reviewed: 09/19/2015 Elsevier Interactive Patient Education  2017 [place] of Pregnancy The second trimester is from week 14 through week 27 (months 4 through 6). The second trimester is often a time when you feel your best. Your body has adjusted to being pregnant, and you begin to feel better physically. Usually, morning sickness has lessened or quit completely, you may have more energy, and you may have an increase in appetite. The second trimester is also a time when the fetus is growing  rapidly. At the end of the sixth month, the fetus is about 9 inches long and weighs about 1 pounds. You will likely begin to feel the baby move (quickening) between 16 and 20 weeks of pregnancy. Body changes during your second trimester Your body continues to go through many changes during your second trimester. The changes vary from woman to woman.  Your weight will continue to increase. You will notice your lower abdomen bulging out.  You may begin to get stretch marks on your hips, abdomen, and breasts.  You may develop headaches that can be  relieved by medicines. The medicines should be approved by your health care provider.  You may urinate more often because the fetus is pressing on your bladder.  You may develop or continue to have heartburn as a result of your pregnancy.  You may develop constipation because certain hormones are causing the muscles that push waste through your intestines to slow down.  You may develop hemorrhoids or swollen, bulging veins (varicose veins).  You may have back pain. This is caused by:  Weight gain.  Pregnancy hormones that are relaxing the joints in your pelvis.  A shift in weight and the muscles that support your balance.  Your breasts will continue to grow and they will continue to become tender.  Your gums may bleed and may be sensitive to brushing and flossing.  Dark spots or blotches (chloasma, mask of pregnancy) may develop on your face. This will likely fade after the baby is born.  A dark line from your belly button to the pubic area (linea nigra) may appear. This will likely fade after the baby is born.  You may have changes in your hair. These can include thickening of your hair, rapid growth, and changes in texture. Some women also have hair loss during or after pregnancy, or hair that feels dry or thin. Your hair will most likely return to normal after your baby is born. What to expect at prenatal visits During a routine prenatal visit:  You will be weighed to make sure you and the fetus are growing normally.  Your blood pressure will be taken.  Your abdomen will be measured to track your baby's growth.  The fetal heartbeat will be listened to.  Any test results from the previous visit will be discussed. Your health care provider may ask you:  How you are feeling.  If you are feeling the baby move.  If you have had any abnormal symptoms, such as leaking fluid, bleeding, severe headaches, or abdominal cramping.  If you are using any tobacco products, including  cigarettes, chewing tobacco, and electronic cigarettes.  If you have any questions. Other tests that may be performed during your second trimester include:  Blood tests that check for:  Low iron levels (anemia).  High blood sugar that affects pregnant women (gestational diabetes) between 99 and 28 weeks.  Rh antibodies. This is to check for a protein on red blood cells (Rh factor).  Urine tests to check for infections, diabetes, or protein in the urine.  An ultrasound to confirm the proper growth and development of the baby.  An amniocentesis to check for possible genetic problems.  Fetal screens for spina bifida and Down syndrome.  HIV (human immunodeficiency virus) testing. Routine prenatal testing includes screening for HIV, unless you choose not to have this test. Follow these instructions at home: Medicines   Follow your health care provider's instructions regarding medicine use. Specific medicines may be either safe  or unsafe to take during pregnancy.  Take a prenatal vitamin that contains at least 600 micrograms (mcg) of folic acid.  If you develop constipation, try taking a stool softener if your health care provider approves. Eating and drinking   Eat a balanced diet that includes fresh fruits and vegetables, whole grains, good sources of protein such as meat, eggs, or tofu, and low-fat dairy. Your health care provider will help you determine the amount of weight gain that is right for you.  Avoid raw meat and uncooked cheese. These carry germs that can cause birth defects in the baby.  If you have low calcium intake from food, talk to your health care provider about whether you should take a daily calcium supplement.  Limit foods that are high in fat and processed sugars, such as fried and sweet foods.  To prevent constipation:  Drink enough fluid to keep your urine clear or pale yellow.  Eat foods that are high in fiber, such as fresh fruits and vegetables,  whole grains, and beans. Activity   Exercise only as directed by your health care provider. Most women can continue their usual exercise routine during pregnancy. Try to exercise for 30 minutes at least 5 days a week. Stop exercising if you experience uterine contractions.  Avoid heavy lifting, wear low heel shoes, and practice good posture.  A sexual relationship may be continued unless your health care provider directs you otherwise. Relieving pain and discomfort   Wear a good support bra to prevent discomfort from breast tenderness.  Take warm sitz baths to soothe any pain or discomfort caused by hemorrhoids. Use hemorrhoid cream if your health care provider approves.  Rest with your legs elevated if you have leg cramps or low back pain.  If you develop varicose veins, wear support hose. Elevate your feet for 15 minutes, 3-4 times a day. Limit salt in your diet. Prenatal Care   Write down your questions. Take them to your prenatal visits.  Keep all your prenatal visits as told by your health care provider. This is important. Safety   Wear your seat belt at all times when driving.  Make a list of emergency phone numbers, including numbers for family, friends, the hospital, and police and fire departments. General instructions   Ask your health care provider for a referral to a local prenatal education class. Begin classes no later than the beginning of month 6 of your pregnancy.  Ask for help if you have counseling or nutritional needs during pregnancy. Your health care provider can offer advice or refer you to specialists for help with various needs.  Do not use hot tubs, steam rooms, or saunas.  Do not douche or use tampons or scented sanitary pads.  Do not cross your legs for long periods of time.  Avoid cat litter boxes and soil used by cats. These carry germs that can cause birth defects in the baby and possibly loss of the fetus by miscarriage or stillbirth.  Avoid  all smoking, herbs, alcohol, and unprescribed drugs. Chemicals in these products can affect the formation and growth of the baby.  Do not use any products that contain nicotine or tobacco, such as cigarettes and e-cigarettes. If you need help quitting, ask your health care provider.  Visit your dentist if you have not gone yet during your pregnancy. Use a soft toothbrush to brush your teeth and be gentle when you floss. Contact a health care provider if:  You have dizziness.  You  have mild pelvic cramps, pelvic pressure, or nagging pain in the abdominal area.  You have persistent nausea, vomiting, or diarrhea.  You have a bad smelling vaginal discharge.  You have pain when you urinate. Get help right away if:  You have a fever.  You are leaking fluid from your vagina.  You have spotting or bleeding from your vagina.  You have severe abdominal cramping or pain.  You have rapid weight gain or weight loss.  You have shortness of breath with chest pain.  You notice sudden or extreme swelling of your face, hands, ankles, feet, or legs.  You have not felt your baby move in over an hour.  You have severe headaches that do not go away when you take medicine.  You have vision changes. Summary  The second trimester is from week 14 through week 27 (months 4 through 6). It is also a time when the fetus is growing rapidly.  Your body goes through many changes during pregnancy. The changes vary from woman to woman.  Avoid all smoking, herbs, alcohol, and unprescribed drugs. These chemicals affect the formation and growth your baby.  Do not use any tobacco products, such as cigarettes, chewing tobacco, and e-cigarettes. If you need help quitting, ask your health care provider.  Contact your health care provider if you have any questions. Keep all prenatal visits as told by your health care provider. This is important. This information is not intended to replace advice given to you by  your health care provider. Make sure you discuss any questions you have with your health care provider. Document Released: 03/30/2001 Document Revised: 09/11/2015 Document Reviewed: 06/06/2012 Elsevier Interactive Patient Education  2017 [employer].

## 2016-08-23 ENCOUNTER — Encounter: Payer: Self-pay | Admitting: Obstetrics and Gynecology

## 2016-08-23 DIAGNOSIS — E669 Obesity, unspecified: Secondary | ICD-10-CM | POA: Insufficient documentation

## 2016-08-23 DIAGNOSIS — Z862 Personal history of diseases of the blood and blood-forming organs and certain disorders involving the immune mechanism: Secondary | ICD-10-CM | POA: Insufficient documentation

## 2016-08-23 DIAGNOSIS — Z3480 Encounter for supervision of other normal pregnancy, unspecified trimester: Secondary | ICD-10-CM | POA: Insufficient documentation

## 2016-08-23 DIAGNOSIS — R8789 Other abnormal findings in specimens from female genital organs: Secondary | ICD-10-CM | POA: Insufficient documentation

## 2016-08-23 DIAGNOSIS — R87618 Other abnormal cytological findings on specimens from cervix uteri: Secondary | ICD-10-CM | POA: Insufficient documentation

## 2016-08-23 DIAGNOSIS — R030 Elevated blood-pressure reading, without diagnosis of hypertension: Secondary | ICD-10-CM | POA: Insufficient documentation

## 2016-08-23 DIAGNOSIS — Z8759 Personal history of other complications of pregnancy, childbirth and the puerperium: Secondary | ICD-10-CM | POA: Insufficient documentation

## 2016-08-23 DIAGNOSIS — O09299 Supervision of pregnancy with other poor reproductive or obstetric history, unspecified trimester: Secondary | ICD-10-CM | POA: Insufficient documentation

## 2016-08-23 NOTE — Progress Notes (Signed)
OBSTETRIC INITIAL PRENATAL VISIT  Subjective:    Jessica Cantu is being seen today for her first obstetrical visit.  This is not a planned pregnancy. She is a G28P2002 female at [redacted]w[redacted]d gestation, Estimated Date of Delivery: 03/02/17 with Patient's last menstrual period was 05/26/2016 (exact date). (consistent with 8 week sono). Her obstetrical history is significant for macrosomia and obesity. Relationship with FOB: spouse, living together. Patient does intend to breast feed. Pregnancy history fully reviewed.     Obstetric History   G3   P2   T2   P0   A0   L2    SAB0   TAB0   Ectopic0   Multiple0   Live Births2     # Outcome Date GA Lbr Len/2nd Weight Sex Delivery Anes PTL Lv  3 Current           2 Term 01/02/16 [redacted]w[redacted]d  9 lb 4.9 oz (4.221 kg) M Vag-Spont  N LIV     Complications: Polyhydramnios  1 Term 10/31/14 [redacted]w[redacted]d  10 lb 14 oz (4.933 kg) M Vag-Spont   LIV     Complications: Gestational thrombocytopenia [facility])      Gynecologic History:  Last pap smear was 07/26/2016.  Results were abnormal, NILM but HPV HR +. Admits to h/o abnormal pap smear in the past (same result, in 2016 with prior pregnancy, declined colposcopy).  Denies history of STIs.    Past Medical History:  Diagnosis Date  . Dermoid cyst of ovary    left  . Fibrocystic breast changes   . H/O gastroesophageal reflux (GERD)   . Vaginal Pap smear, abnormal 12/2014   Normal pap, HPV HR positive (type 18). Declined colposcopy    Family History  Problem Relation Age of Onset  . Hypertension Father   . Diabetes Maternal Grandmother   . Hypertension Maternal Grandmother   . Diabetes Maternal Grandfather   . Diabetes Paternal Grandmother   . Diabetes Paternal Grandfather   . Cancer Paternal Grandfather      Past Surgical History:  Procedure Laterality Date  . TONSILLECTOMY       Social History   Social History  . Marital status: Married    Spouse name: N/A  . Number of children: N/A  . Years of education:  N/A   Occupational History  . Not on file.   Social History Main Topics  . Smoking status: Never Smoker  . Smokeless tobacco: Never Used  . Alcohol use Yes     Comment: social  . Drug use: No  . Sexual activity: Yes    Partners: Male    Birth control/ protection: None   Other Topics Concern  . Not on file   Social History Narrative  . No narrative on file     Current Outpatient Prescriptions on File Prior to Visit  Medication Sig Dispense Refill  . Prenatal Vit-Fe Fumarate-FA (PRENATAL MULTIVITAMIN) TABS tablet Take 1 tablet by mouth daily at 12 noon.     No current facility-administered medications on file prior to visit.      No Known Allergies   Review of Systems General:Not Present- Fever, Weight Loss and Weight Gain. Skin:Not Present- Rash. HEENT:Not Present- Blurred Vision, Headache and Bleeding Gums. Respiratory:Not Present- Difficulty Breathing. Breast:Not Present- Breast Mass. Cardiovascular:Not Present- Chest Pain, Elevated Blood Pressure, Fainting / Blacking Out and Shortness of Breath. Gastrointestinal:Not Present- Abdominal Pain, Constipation, Nausea and Vomiting. Female Genitourinary:Not Present- Frequency, Painful Urination, Pelvic Pain, Vaginal Bleeding, Vaginal Discharge, Contractions, regular,  Fetal Movements Decreased, Urinary Complaints and Vaginal Fluid. Musculoskeletal:Not Present- Back Pain and Leg Cramps. Neurological:Not Present- Dizziness. Psychiatric:Not Present- Depression.     Objective:   Blood pressure (!) 148/88, pulse 80, weight 220 lb 11.2 oz (100.1 kg), last menstrual period 05/26/2016.  Body mass index is 31.67 kg/m.  General Appearance:    Alert, cooperative, no distress, appears stated age  Head:    Normocephalic, without obvious abnormality, atraumatic  Eyes:    PERRL, conjunctiva/corneas clear, EOM's intact, both eyes  Ears:    Normal external ear canals, both ears  Nose:   Nares normal, septum midline, mucosa  normal, no drainage or sinus tenderness  Throat:   Lips, mucosa, and tongue normal; teeth and gums normal  Neck:   Supple, symmetrical, trachea midline, no adenopathy; thyroid: no enlargement/tenderness/nodules; no carotid bruit or JVD  Back:     Symmetric, no curvature, ROM normal, no CVA tenderness  Lungs:     Clear to auscultation bilaterally, respirations unlabored  Chest Wall:    No tenderness or deformity   Heart:    Regular rate and rhythm, S1 and S2 normal, no murmur, rub or gallop  Breast Exam:    No tenderness, masses, or nipple abnormality  Abdomen:     Soft, non-tender, bowel sounds active all four quadrants, no masses, no organomegaly.  FH 12.  FHT 162 bpm.  Genitalia:    Pelvic: exam deferred.  Patient declines exam today stating she just had one.  Please see Dr. Phoebe Perch note from 07/06/2016.   Rectal:    Normal external sphincter.  No hemorrhoids appreciated. Internal exam not done.   Extremities:   Extremities normal, atraumatic, no cyanosis or edema  Pulses:   2+ and symmetric all extremities  Skin:   Skin color, texture, turgor normal, no rashes or lesions  Lymph nodes:   Cervical, supraclavicular, and axillary nodes normal  Neurologic:   CNII-XII intact, normal strength, sensation and reflexes throughout      Assessment:    Pregnancy at 12 and 1/7 weeks   H/o fetal macrosomia Obesity in pregnancy H/o abnormal pap smear Elevated BP Short pregnancy interval History of polyhydramnios in prior pregnancy   Plan:   - Initial labs reviewed.  All normal except pap smear.  Patient with h/o HPV+ pap smear, declined colposcopy in the past.  Discussed importance and the need for colposcopy at some point, can be postponed until after pregnancy completed.  - Prenatal vitamins encouraged. - Problem list reviewed and updated. - New OB counseling:  The patient has been given an overview regarding routine prenatal care.  Recommendations regarding diet, weight gain, and exercise in  pregnancy were given. - Prenatal testing, optional genetic testing, and ultrasound use in pregnancy were reviewed.  All genetic testing: declined. - Benefits of Breast Feeding were discussed. The patient is encouraged to consider nursing her baby post partum. - Obesity with h/o fetal macrosomia.  Patient will need early glucola.  Notes that she declined glucola in last pregnancy and will likely not do this pregnancy.  Would consider jelly bean test again (did during 1st pregnancy).  - Elevated BP noted on today's visit with repeat also abnormal.  Also review of past few visits noting mildly elevated BP (although repeat those days were normal). Discussed need for baseline PIH labs (creatinine).  Patient became very irrate and declined blood draw today.  Patient states that her BP is only high when she is in the office and does not like  needle sticks.  Discussed getting a BP monitor at home, or checking BP several times before next visit at a local pharmacy.  Will need to bring BP log.  Will return in 2 weeks for blood draw and repeat BP check and BP log review.   - Will monitor for polyhydramnios and gestational thrombocytopenia in current pregnancy as patient has a history in prior pregnancies. - Follow up in 2 weeks.  70% of 30 min visit spent on counseling and coordination of care.

## 2016-08-23 NOTE — Addendum Note (Signed)
Addended by: Augusto Gamble on: 08/23/2016 10:57 AM   Modules accepted: Level of Service

## 2016-09-02 ENCOUNTER — Encounter: Payer: Managed Care, Other (non HMO) | Admitting: Obstetrics and Gynecology

## 2016-09-15 ENCOUNTER — Encounter: Payer: Self-pay | Admitting: Obstetrics and Gynecology

## 2016-09-15 ENCOUNTER — Ambulatory Visit (INDEPENDENT_AMBULATORY_CARE_PROVIDER_SITE_OTHER): Payer: Managed Care, Other (non HMO) | Admitting: Obstetrics and Gynecology

## 2016-09-15 VITALS — BP 138/72 | HR 98 | Wt 221.4 lb

## 2016-09-15 DIAGNOSIS — Z3492 Encounter for supervision of normal pregnancy, unspecified, second trimester: Secondary | ICD-10-CM

## 2016-09-15 LAB — POCT URINALYSIS DIPSTICK
BILIRUBIN UA: NEGATIVE
GLUCOSE UA: NEGATIVE
Ketones, UA: NEGATIVE
LEUKOCYTES UA: NEGATIVE
NITRITE UA: NEGATIVE
PH UA: 7.5 (ref 5.0–8.0)
Protein, UA: NEGATIVE
RBC UA: NEGATIVE
Spec Grav, UA: 1.01 (ref 1.010–1.025)
UROBILINOGEN UA: 0.2 U/dL

## 2016-09-15 NOTE — Progress Notes (Signed)
ROB:  Pt declines genetic testing and Quad screen.  Discussed in detail.  Reports no problems.    Discussed office staff interactions and behavioral issues that lead to dismissal from [facility].  Pt expressed her concerns and her frustration with the amount of time appointments took.  (her opinion is that we did not respect her time and were constantly behind without sufficiently informing her and we were not prepared for her visits by knowing her previous medical and obstetric history)  She declined to sign the termination form.

## 2016-10-13 DIAGNOSIS — O99210 Obesity complicating pregnancy, unspecified trimester: Secondary | ICD-10-CM | POA: Insufficient documentation

## 2016-10-14 DIAGNOSIS — N83209 Unspecified ovarian cyst, unspecified side: Secondary | ICD-10-CM | POA: Insufficient documentation

## 2016-10-14 DIAGNOSIS — O3480 Maternal care for other abnormalities of pelvic organs, unspecified trimester: Secondary | ICD-10-CM

## 2017-01-04 ENCOUNTER — Observation Stay
Admission: EM | Admit: 2017-01-04 | Discharge: 2017-01-04 | Disposition: A | Discharge status: Home / Self Care | Payer: Managed Care, Other (non HMO) | Attending: Obstetrics and Gynecology | Admitting: Obstetrics and Gynecology

## 2017-01-04 DIAGNOSIS — R030 Elevated blood-pressure reading, without diagnosis of hypertension: Secondary | ICD-10-CM | POA: Diagnosis not present

## 2017-01-04 DIAGNOSIS — Z3A31 31 weeks gestation of pregnancy: Secondary | ICD-10-CM | POA: Insufficient documentation

## 2017-01-04 DIAGNOSIS — O26893 Other specified pregnancy related conditions, third trimester: Secondary | ICD-10-CM | POA: Diagnosis not present

## 2017-01-04 DIAGNOSIS — O169 Unspecified maternal hypertension, unspecified trimester: Secondary | ICD-10-CM | POA: Diagnosis present

## 2017-01-04 DIAGNOSIS — D696 Thrombocytopenia, unspecified: Secondary | ICD-10-CM | POA: Diagnosis not present

## 2017-01-04 LAB — COMPREHENSIVE METABOLIC PANEL
ALBUMIN: 3.5 g/dL (ref 3.5–5.0)
ALT: 12 U/L — AB (ref 14–54)
AST: 23 U/L (ref 15–41)
Alkaline Phosphatase: 104 U/L (ref 38–126)
Anion gap: 10 (ref 5–15)
CALCIUM: 9 mg/dL (ref 8.9–10.3)
CHLORIDE: 103 mmol/L (ref 101–111)
CO2: 24 mmol/L (ref 22–32)
CREATININE: 0.32 mg/dL — AB (ref 0.44–1.00)
GFR calc non Af Amer: >60 mL/min (ref >60)
GLUCOSE: 95 mg/dL (ref 65–99)
Potassium: 3.5 mmol/L (ref 3.5–5.1)
SODIUM: 137 mmol/L (ref 135–145)
Total Bilirubin: 0.4 mg/dL (ref 0.3–1.2)
Total Protein: 7.3 g/dL (ref 6.5–8.1)

## 2017-01-04 LAB — CBC
HCT: 36.4 % (ref 35.0–47.0)
HEMOGLOBIN: 12.2 g/dL (ref 12.0–16.0)
MCH: 26.4 pg (ref 26.0–34.0)
MCHC: 33.6 g/dL (ref 32.0–36.0)
MCV: 78.7 fL — ABNORMAL LOW (ref 80.0–100.0)
PLATELETS: 124 10*3/uL — AB (ref 150–440)
RBC: 4.63 MIL/uL (ref 3.80–5.20)
RDW: 14.6 % — ABNORMAL HIGH (ref 11.5–14.5)
WBC: 8.2 10*3/uL (ref 3.6–11.0)

## 2017-01-04 LAB — PROTEIN / CREATININE RATIO, URINE: Creatinine, Urine: 30 mg/dL

## 2017-01-04 NOTE — Progress Notes (Signed)
Patient ID: Jessica Cantu, female   DOB: [birth year], 31 y.o.   MRN: [id number]  Jessica Cantu is a 31 y.o. female. She is at [redacted]w[redacted]d gestation. Patient's last menstrual period was 05/26/2016 (exact date). Estimated Date of Delivery: 03/02/17  Prenatal care site: [facility]  Chief complaint:elevated BP in office   Location: Onset/timing: Duration: Quality:  Severity: Aggravating or alleviating conditions: Associated signs/symptoms: Context:  S: no h/a , no vision change   Maternal Medical History:   Past Medical History:  Diagnosis Date  . Dermoid cyst of ovary    left  . Fibrocystic breast changes   . H/O gastroesophageal reflux (GERD)   . Medical history non-contributory   . Vaginal Pap smear, abnormal 12/2014   Normal pap, HPV HR positive (type 18). Declined colposcopy    Past Surgical History:  Procedure Laterality Date  . TONSILLECTOMY      No Known Allergies  Prior to Admission medications   Medication Sig Start Date End Date Taking? Authorizing Provider  Prenatal Vit-Fe Fumarate-FA (PRENATAL MULTIVITAMIN) TABS tablet Take 1 tablet by mouth daily at 12 noon.   Yes [provider]     Social History: She  reports that she has never smoked. She has never used smokeless tobacco. She reports that she drinks alcohol. She reports that she does not use drugs.  Family History: family history includes Cancer in her paternal grandfather; Diabetes in her maternal grandfather, maternal grandmother, paternal grandfather, and paternal grandmother; Hypertension in her father and maternal grandmother.  no history of gyn cancers  Review of Systems: A full review of systems was performed and negative except as noted in the HPI.     O:  BP 123/74   Pulse 82   Temp 98.5 F (36.9 C) (Oral)   Resp 18   Ht 5\' 10"  (1.778 m)   Wt 103.9 kg (229 lb)   LMP 05/26/2016 (Exact Date)   BMI 32.86 kg/m  Results for orders placed or performed during the hospital  encounter of 01/04/17 (from the past 48 hour(s))  CBC   Collection Time: 01/04/17 11:58 AM  Result Value Ref Range   WBC 8.2 3.6 - 11.0 K/uL   RBC 4.63 3.80 - 5.20 MIL/uL   Hemoglobin 12.2 12.0 - 16.0 g/dL   HCT 36.4 35.0 - 47.0 %   MCV 78.7 (L) 80.0 - 100.0 fL   MCH 26.4 26.0 - 34.0 pg   MCHC 33.6 32.0 - 36.0 g/dL   RDW 14.6 (H) 11.5 - 14.5 %   Platelets 124 (L) 150 - 440 K/uL  Comprehensive metabolic panel   Collection Time: 01/04/17 11:58 AM  Result Value Ref Range   Sodium 137 135 - 145 mmol/L   Potassium 3.5 3.5 - 5.1 mmol/L   Chloride 103 101 - 111 mmol/L   CO2 24 22 - 32 mmol/L   Glucose, Bld 95 65 - 99 mg/dL   BUN <5 (L) 6 - 20 mg/dL   Creatinine, Ser 0.32 (L) 0.44 - 1.00 mg/dL   Calcium 9.0 8.9 - 10.3 mg/dL   Total Protein 7.3 6.5 - 8.1 g/dL   Albumin 3.5 3.5 - 5.0 g/dL   AST 23 15 - 41 U/L   ALT 12 (L) 14 - 54 U/L   Alkaline Phosphatase 104 38 - 126 U/L   Total Bilirubin 0.4 0.3 - 1.2 mg/dL   GFR calc non Af Amer >60 >60 mL/min   GFR calc Af Amer >60 >60 mL/min  Anion gap 10 5 - 15  Protein / creatinine ratio, urine   Collection Time: 01/04/17 12:12 PM  Result Value Ref Range   Creatinine, Urine 30 mg/dL   Total Protein, Urine <6 mg/dL   Protein Creatinine Ratio        0.00 - 0.15 mg/mg[Cre]     Constitutional: NAD, AAOx3  HE/ENT: extraocular movements grossly intact, moist mucous membranes CV: RRR PULM: nl respiratory effort, CTABL     Abd: gravid, non-tender, non-distended, soft      Ext: Non-tender, Nonedmeatous   Psych: mood appropriate, speech normal Pelvic deferred  NST: reactive    A/P: 31 y.o. [redacted]w[redacted]d here for antenatal surveillance for elevated BP   bp normalized on L+D   Labs normal except mild thrombocytopenia    Labor: not present.   Fetal Wellbeing: Reassuring Cat 1 tracing.  Reactive NST   D/c home stable, precautions reviewed, follow-up as scheduled.   ----- Gwen Her Javier Mamone MD Attending Obstetrician and  Gynecologist [facility], Department of [facility]

## 2017-01-04 NOTE — Discharge Summary (Signed)
Schermerhorn, Gwen Her, MD  Obstetrics    [] Hide copied text [] Hover for attribution information Patient ID: Jessica Cantu, female   DOB: 09-23-1985, 31 y.o.   MRN: [id number]  Jessica Cantu is a 31 y.o. female. She is at [redacted]w[redacted]d gestation. Patient's last menstrual period was 05/26/2016 (exact date). Estimated Date of Delivery: 03/02/17  Prenatal care site: [facility]  Chief complaint:elevated BP in office   Location: Onset/timing: Duration: Quality:  Severity: Aggravating or alleviating conditions: Associated signs/symptoms: Context:  S: no h/a , no vision change   Maternal Medical History:       Past Medical History:  Diagnosis Date  . Dermoid cyst of ovary    left  . Fibrocystic breast changes   . H/O gastroesophageal reflux (GERD)   . Medical history non-contributory   . Vaginal Pap smear, abnormal 12/2014   Normal pap, HPV HR positive (type 18). Declined colposcopy         Past Surgical History:  Procedure Laterality Date  . TONSILLECTOMY      No Known Allergies         Prior to Admission medications   Medication Sig Start Date End Date Taking? Authorizing Provider  Prenatal Vit-Fe Fumarate-FA (PRENATAL MULTIVITAMIN) TABS tablet Take 1 tablet by mouth daily at 12 noon.   Yes [provider]     Social History: She  reports that she has never smoked. She has never used smokeless tobacco. She reports that she drinks alcohol. She reports that she does not use drugs.  Family History: family history includes Cancer in her paternal grandfather; Diabetes in her maternal grandfather, maternal grandmother, paternal grandfather, and paternal grandmother; Hypertension in her father and maternal grandmother.  no history of gyn cancers  Review of Systems: A full review of systems was performed and negative except as noted in the HPI.     O:  BP 123/74   Pulse 82   Temp 98.5 F (36.9 C) (Oral)   Resp 18   Ht 5\' 10"   (1.778 m)   Wt 103.9 kg (229 lb)   LMP 05/26/2016 (Exact Date)   BMI 32.86 kg/m       Results for orders placed or performed during the hospital encounter of 01/04/17 (from the past 48 hour(s))  CBC   Collection Time: 01/04/17 11:58 AM  Result Value Ref Range   WBC 8.2 3.6 - 11.0 K/uL   RBC 4.63 3.80 - 5.20 MIL/uL   Hemoglobin 12.2 12.0 - 16.0 g/dL   HCT 36.4 35.0 - 47.0 %   MCV 78.7 (L) 80.0 - 100.0 fL   MCH 26.4 26.0 - 34.0 pg   MCHC 33.6 32.0 - 36.0 g/dL   RDW 14.6 (H) 11.5 - 14.5 %   Platelets 124 (L) 150 - 440 K/uL  Comprehensive metabolic panel   Collection Time: 01/04/17 11:58 AM  Result Value Ref Range   Sodium 137 135 - 145 mmol/L   Potassium 3.5 3.5 - 5.1 mmol/L   Chloride 103 101 - 111 mmol/L   CO2 24 22 - 32 mmol/L   Glucose, Bld 95 65 - 99 mg/dL   BUN <5 (L) 6 - 20 mg/dL   Creatinine, Ser 0.32 (L) 0.44 - 1.00 mg/dL   Calcium 9.0 8.9 - 10.3 mg/dL   Total Protein 7.3 6.5 - 8.1 g/dL   Albumin 3.5 3.5 - 5.0 g/dL   AST 23 15 - 41 U/L   ALT 12 (L) 14 - 54 U/L  Alkaline Phosphatase 104 38 - 126 U/L   Total Bilirubin 0.4 0.3 - 1.2 mg/dL   GFR calc non Af Amer >60 >60 mL/min   GFR calc Af Amer >60 >60 mL/min   Anion gap 10 5 - 15  Protein / creatinine ratio, urine   Collection Time: 01/04/17 12:12 PM  Result Value Ref Range   Creatinine, Urine 30 mg/dL   Total Protein, Urine <6 mg/dL   Protein Creatinine Ratio        0.00 - 0.15 mg/mg[Cre]   Constitutional: NAD, AAOx3  HE/ENT: extraocular movements grossly intact, moist mucous membranes CV: RRR PULM: nl respiratory effort, CTABL                                         Abd: gravid, non-tender, non-distended, soft                                                  Ext: Non-tender, Nonedmeatous                     Psych: mood appropriate, speech normal Pelvic deferred  NST: reactive    A/P: 31 y.o. [redacted]w[redacted]d here for antenatal surveillance for elevated BP   bp normalized on  L+D   Labs normal except mild thrombocytopenia    Labor: not present.   Fetal Wellbeing: Reassuring Cat 1 tracing.  Reactive NST   D/c home stable, precautions reviewed, follow-up as scheduled.   ----- Gwen Her Schermerhorn MD Attending Obstetrician and Gynecologist [facility], Department of OB/GYN [facility]     Electronically signed by Schermerhorn, Gwen Her, MD at 01/04/2017 3:08 PM

## 2017-01-04 NOTE — OB Triage Note (Signed)
Pt was sent over from [facility] due to elevated BP. Pt here for evaluation. Pt denies VB and LOF, and states +FM. Blurry vision in right eye for the past 2 weeks.

## 2017-02-12 LAB — OB RESULTS CONSOLE GBS: GBS: NEGATIVE

## 2017-02-24 ENCOUNTER — Other Ambulatory Visit: Payer: Self-pay | Admitting: Obstetrics and Gynecology

## 2017-03-02 ENCOUNTER — Inpatient Hospital Stay
Admission: EM | Admit: 2017-03-02 | Discharge: 2017-03-04 | DRG: 806 | Disposition: A | Discharge status: Home / Self Care | Payer: 59 | Attending: Obstetrics and Gynecology | Admitting: Obstetrics and Gynecology

## 2017-03-02 DIAGNOSIS — O480 Post-term pregnancy: Secondary | ICD-10-CM | POA: Diagnosis present

## 2017-03-02 DIAGNOSIS — E669 Obesity, unspecified: Secondary | ICD-10-CM | POA: Diagnosis present

## 2017-03-02 DIAGNOSIS — O3663X0 Maternal care for excessive fetal growth, third trimester, not applicable or unspecified: Principal | ICD-10-CM | POA: Diagnosis present

## 2017-03-02 DIAGNOSIS — N83209 Unspecified ovarian cyst, unspecified side: Secondary | ICD-10-CM | POA: Diagnosis present

## 2017-03-02 DIAGNOSIS — O3483 Maternal care for other abnormalities of pelvic organs, third trimester: Secondary | ICD-10-CM | POA: Diagnosis present

## 2017-03-02 DIAGNOSIS — O99214 Obesity complicating childbirth: Secondary | ICD-10-CM | POA: Diagnosis present

## 2017-03-02 DIAGNOSIS — Z3A40 40 weeks gestation of pregnancy: Secondary | ICD-10-CM

## 2017-03-02 DIAGNOSIS — D6959 Other secondary thrombocytopenia: Secondary | ICD-10-CM | POA: Diagnosis present

## 2017-03-02 DIAGNOSIS — O134 Gestational [pregnancy-induced] hypertension without significant proteinuria, complicating childbirth: Secondary | ICD-10-CM | POA: Diagnosis present

## 2017-03-02 DIAGNOSIS — O9912 Other diseases of the blood and blood-forming organs and certain disorders involving the immune mechanism complicating childbirth: Secondary | ICD-10-CM | POA: Diagnosis present

## 2017-03-02 HISTORY — DX: Other diseases of the blood and blood-forming organs and certain disorders involving the immune mechanism complicating pregnancy, unspecified trimester: O99.119

## 2017-03-02 HISTORY — DX: Benign neoplasm of right ovary: D27.0

## 2017-03-02 HISTORY — DX: Thrombocytopenia, unspecified: D69.6

## 2017-03-02 HISTORY — DX: Cervical high risk human papillomavirus (HPV) DNA test positive: R87.810

## 2017-03-03 ENCOUNTER — Inpatient Hospital Stay: Payer: 59 | Admitting: Registered Nurse

## 2017-03-03 ENCOUNTER — Other Ambulatory Visit: Payer: Self-pay

## 2017-03-03 DIAGNOSIS — E669 Obesity, unspecified: Secondary | ICD-10-CM | POA: Diagnosis present

## 2017-03-03 DIAGNOSIS — D6959 Other secondary thrombocytopenia: Secondary | ICD-10-CM | POA: Diagnosis present

## 2017-03-03 DIAGNOSIS — O3483 Maternal care for other abnormalities of pelvic organs, third trimester: Secondary | ICD-10-CM | POA: Diagnosis present

## 2017-03-03 DIAGNOSIS — O134 Gestational [pregnancy-induced] hypertension without significant proteinuria, complicating childbirth: Secondary | ICD-10-CM | POA: Diagnosis present

## 2017-03-03 DIAGNOSIS — N83209 Unspecified ovarian cyst, unspecified side: Secondary | ICD-10-CM | POA: Diagnosis present

## 2017-03-03 DIAGNOSIS — Z3A40 40 weeks gestation of pregnancy: Secondary | ICD-10-CM | POA: Diagnosis not present

## 2017-03-03 DIAGNOSIS — D279 Benign neoplasm of unspecified ovary: Secondary | ICD-10-CM | POA: Insufficient documentation

## 2017-03-03 DIAGNOSIS — O99214 Obesity complicating childbirth: Secondary | ICD-10-CM | POA: Diagnosis present

## 2017-03-03 DIAGNOSIS — O480 Post-term pregnancy: Secondary | ICD-10-CM | POA: Diagnosis present

## 2017-03-03 DIAGNOSIS — O3663X0 Maternal care for excessive fetal growth, third trimester, not applicable or unspecified: Secondary | ICD-10-CM | POA: Diagnosis present

## 2017-03-03 DIAGNOSIS — O26843 Uterine size-date discrepancy, third trimester: Secondary | ICD-10-CM | POA: Insufficient documentation

## 2017-03-03 DIAGNOSIS — O9912 Other diseases of the blood and blood-forming organs and certain disorders involving the immune mechanism complicating childbirth: Secondary | ICD-10-CM | POA: Diagnosis present

## 2017-03-03 LAB — CBC
HEMATOCRIT: 37.4 % (ref 35.0–47.0)
HEMOGLOBIN: 12.2 g/dL (ref 12.0–16.0)
MCH: 25.7 pg — AB (ref 26.0–34.0)
MCHC: 32.5 g/dL (ref 32.0–36.0)
MCV: 79 fL — AB (ref 80.0–100.0)
Platelets: 113 10*3/uL — ABNORMAL LOW (ref 150–440)
RBC: 4.74 MIL/uL (ref 3.80–5.20)
RDW: 16.2 % — AB (ref 11.5–14.5)
WBC: 7.8 10*3/uL (ref 3.6–11.0)

## 2017-03-03 LAB — COMPREHENSIVE METABOLIC PANEL
ALBUMIN: 3.4 g/dL — AB (ref 3.5–5.0)
ALK PHOS: 187 U/L — AB (ref 38–126)
ALT: 10 U/L — ABNORMAL LOW (ref 14–54)
ANION GAP: 11 (ref 5–15)
AST: 33 U/L (ref 15–41)
BILIRUBIN TOTAL: 0.1 mg/dL — AB (ref 0.3–1.2)
BUN: <5 mg/dL — ABNORMAL LOW (ref 6–20)
CALCIUM: 8.6 mg/dL — AB (ref 8.9–10.3)
CO2: 22 mmol/L (ref 22–32)
Chloride: 104 mmol/L (ref 101–111)
Creatinine, Ser: 0.47 mg/dL (ref 0.44–1.00)
Glucose, Bld: 98 mg/dL (ref 65–99)
POTASSIUM: 3.8 mmol/L (ref 3.5–5.1)
Sodium: 137 mmol/L (ref 135–145)
TOTAL PROTEIN: 6.8 g/dL (ref 6.5–8.1)

## 2017-03-03 LAB — TYPE AND SCREEN
ABO/RH(D): A POS
Antibody Screen: NEGATIVE

## 2017-03-03 LAB — PROTEIN / CREATININE RATIO, URINE
CREATININE, URINE: 40 mg/dL
Total Protein, Urine: <6 mg/dL

## 2017-03-03 MED ORDER — SOD CITRATE-CITRIC ACID 500-334 MG/5ML PO SOLN: 30.0000 mL | ORAL | Status: DC | PRN

## 2017-03-03 MED ORDER — SODIUM CHLORIDE 0.9% FLUSH: 3.0000 mL | INTRAVENOUS | Status: DC | PRN

## 2017-03-03 MED ORDER — BUTORPHANOL TARTRATE 1 MG/ML IJ SOLN: 1.0000 mg | INTRAMUSCULAR | Status: DC | PRN

## 2017-03-03 MED ORDER — SODIUM CHLORIDE 0.9% FLUSH: 3.0000 mL | Freq: Two times a day (BID) | INTRAVENOUS | Status: DC

## 2017-03-03 MED ORDER — DIBUCAINE 1 % RE OINT: 1.0000 "application " | TOPICAL_OINTMENT | RECTAL | Status: DC | PRN

## 2017-03-03 MED ORDER — METHYLERGONOVINE MALEATE 0.2 MG/ML IJ SOLN
0.2000 mg | Freq: Once | INTRAMUSCULAR | Status: AC
  Administered 2017-03-03: 0.2 mg via INTRAMUSCULAR

## 2017-03-03 MED ORDER — ACETAMINOPHEN 325 MG PO TABS: 650.0000 mg | ORAL_TABLET | ORAL | Status: DC | PRN

## 2017-03-03 MED ORDER — BENZOCAINE-MENTHOL 20-0.5 % EX AERO
1.0000 "application " | INHALATION_SPRAY | CUTANEOUS | Status: DC | PRN
  Filled 2017-03-03: qty 56

## 2017-03-03 MED ORDER — ONDANSETRON HCL 4 MG/2ML IJ SOLN: 4.0000 mg | INTRAMUSCULAR | Status: DC | PRN

## 2017-03-03 MED ORDER — COCONUT OIL OIL
1.0000 "application " | TOPICAL_OIL | Status: DC | PRN
  Administered 2017-03-04: 1 via TOPICAL
  Filled 2017-03-03: qty 120

## 2017-03-03 MED ORDER — SENNOSIDES-DOCUSATE SODIUM 8.6-50 MG PO TABS
2.0000 | ORAL_TABLET | ORAL | Status: DC
  Filled 2017-03-03: qty 2

## 2017-03-03 MED ORDER — LACTATED RINGERS IV SOLN
INTRAVENOUS | Status: DC
  Administered 2017-03-03 (×3): via INTRAVENOUS

## 2017-03-03 MED ORDER — SODIUM CHLORIDE 0.9 % IV SOLN: 250.0000 mL | INTRAVENOUS | Status: DC | PRN

## 2017-03-03 MED ORDER — PRENATAL MULTIVITAMIN CH
1.0000 | ORAL_TABLET | Freq: Every day | ORAL | Status: DC
  Filled 2017-03-03: qty 1

## 2017-03-03 MED ORDER — ZOLPIDEM TARTRATE 5 MG PO TABS: 5.0000 mg | ORAL_TABLET | Freq: Every evening | ORAL | Status: DC | PRN

## 2017-03-03 MED ORDER — LACTATED RINGERS IV SOLN: 500.0000 mL | INTRAVENOUS | Status: DC | PRN

## 2017-03-03 MED ORDER — IBUPROFEN 600 MG PO TABS
600.0000 mg | ORAL_TABLET | Freq: Four times a day (QID) | ORAL | Status: DC
Start: 2017-03-03 — End: 2017-03-04
  Administered 2017-03-03 – 2017-03-04 (×4): 600 mg via ORAL
  Filled 2017-03-03 (×4): qty 1

## 2017-03-03 MED ORDER — LIDOCAINE-EPINEPHRINE (PF) 1.5 %-1:200000 IJ SOLN
INTRAMUSCULAR | Status: DC | PRN
  Administered 2017-03-03: 3 mL via PERINEURAL

## 2017-03-03 MED ORDER — FLEET ENEMA 7-19 GM/118ML RE ENEM: 1.0000 | ENEMA | Freq: Every day | RECTAL | Status: DC | PRN

## 2017-03-03 MED ORDER — WITCH HAZEL-GLYCERIN EX PADS: 1.0000 "application " | MEDICATED_PAD | CUTANEOUS | Status: DC | PRN

## 2017-03-03 MED ORDER — BISACODYL 10 MG RE SUPP
10.0000 mg | Freq: Every day | RECTAL | Status: DC | PRN
  Filled 2017-03-03: qty 1

## 2017-03-03 MED ORDER — METHYLERGONOVINE MALEATE 0.2 MG/ML IJ SOLN
INTRAMUSCULAR | Status: AC
  Administered 2017-03-03: 0.2 mg via INTRAMUSCULAR
  Filled 2017-03-03: qty 1

## 2017-03-03 MED ORDER — OXYTOCIN 40 UNITS IN LACTATED RINGERS INFUSION - SIMPLE MED: 2.5000 [IU]/h | INTRAVENOUS | Status: DC

## 2017-03-03 MED ORDER — METHYLERGONOVINE MALEATE 0.2 MG/ML IJ SOLN
INTRAMUSCULAR | Status: AC
  Filled 2017-03-03: qty 1

## 2017-03-03 MED ORDER — OXYTOCIN 10 UNIT/ML IJ SOLN
INTRAMUSCULAR | Status: AC
  Filled 2017-03-03: qty 2

## 2017-03-03 MED ORDER — OXYCODONE HCL 5 MG PO TABS: 5.0000 mg | ORAL_TABLET | ORAL | Status: DC | PRN

## 2017-03-03 MED ORDER — SODIUM CHLORIDE 0.9 % IV SOLN: 2.0000 g | Freq: Once | INTRAVENOUS | Status: DC

## 2017-03-03 MED ORDER — LIDOCAINE HCL (PF) 1 % IJ SOLN
INTRAMUSCULAR | Status: DC | PRN
  Administered 2017-03-03: 3 mL

## 2017-03-03 MED ORDER — BUPIVACAINE HCL (PF) 0.25 % IJ SOLN
INTRAMUSCULAR | Status: DC | PRN
  Administered 2017-03-03 (×2): 4 mL via EPIDURAL

## 2017-03-03 MED ORDER — MEASLES, MUMPS & RUBELLA VAC ~~LOC~~ INJ
0.5000 mL | INJECTION | Freq: Once | SUBCUTANEOUS | Status: DC
  Filled 2017-03-03: qty 0.5

## 2017-03-03 MED ORDER — OXYTOCIN 40 UNITS IN LACTATED RINGERS INFUSION - SIMPLE MED
1.0000 m[IU]/min | INTRAVENOUS | Status: DC
  Administered 2017-03-03: 1 m[IU]/min via INTRAVENOUS
  Administered 2017-03-03: 20 m[IU]/min via INTRAVENOUS
  Filled 2017-03-03 (×2): qty 1000

## 2017-03-03 MED ORDER — SIMETHICONE 80 MG PO CHEW
80.0000 mg | CHEWABLE_TABLET | ORAL | Status: DC | PRN
  Filled 2017-03-03: qty 1

## 2017-03-03 MED ORDER — DIPHENHYDRAMINE HCL 25 MG PO CAPS: 25.0000 mg | ORAL_CAPSULE | Freq: Four times a day (QID) | ORAL | Status: DC | PRN

## 2017-03-03 MED ORDER — LIDOCAINE HCL (PF) 1 % IJ SOLN
30.0000 mL | INTRAMUSCULAR | Status: DC | PRN
  Filled 2017-03-03: qty 30

## 2017-03-03 MED ORDER — ONDANSETRON HCL 4 MG/2ML IJ SOLN: 4.0000 mg | Freq: Four times a day (QID) | INTRAMUSCULAR | Status: DC | PRN

## 2017-03-03 MED ORDER — TERBUTALINE SULFATE 1 MG/ML IJ SOLN: 0.2500 mg | Freq: Once | INTRAMUSCULAR | Status: DC | PRN

## 2017-03-03 MED ORDER — FENTANYL 2.5 MCG/ML W/ROPIVACAINE 0.15% IN NS 100 ML EPIDURAL (ARMC)
EPIDURAL | Status: AC
  Filled 2017-03-03: qty 100

## 2017-03-03 MED ORDER — MISOPROSTOL 25 MCG QUARTER TABLET: 25.0000 ug | ORAL_TABLET | ORAL | Status: DC | PRN

## 2017-03-03 MED ORDER — AMMONIA AROMATIC IN INHA
RESPIRATORY_TRACT | Status: AC
  Filled 2017-03-03: qty 10

## 2017-03-03 MED ORDER — FENTANYL 2.5 MCG/ML W/ROPIVACAINE 0.15% IN NS 100 ML EPIDURAL (ARMC)
EPIDURAL | Status: DC | PRN
  Administered 2017-03-03: 12 mL/h via EPIDURAL

## 2017-03-03 MED ORDER — TETANUS-DIPHTH-ACELL PERTUSSIS 5-2.5-18.5 LF-MCG/0.5 IM SUSP
0.5000 mL | Freq: Once | INTRAMUSCULAR | Status: DC
  Filled 2017-03-03: qty 0.5

## 2017-03-03 MED ORDER — MISOPROSTOL 200 MCG PO TABS
ORAL_TABLET | ORAL | Status: AC
  Filled 2017-03-03: qty 4

## 2017-03-03 MED ORDER — OXYTOCIN BOLUS FROM INFUSION: 500.0000 mL | Freq: Once | INTRAVENOUS | Status: DC

## 2017-03-03 MED ORDER — ONDANSETRON HCL 4 MG PO TABS: 4.0000 mg | ORAL_TABLET | ORAL | Status: DC | PRN

## 2017-03-03 NOTE — Discharge Summary (Signed)
Obstetrical Discharge Summary  Patient Name: Jessica Cantu DOB: [birth year] MRN: [id number]  Date of Admission: 03/02/2017 Date of Discharge: 03/04/17 Primary OB: [facility]  Gestational Age at Delivery: [redacted]w[redacted]d   Antepartum complications:  Pregnancy Issues: 1. Thrombocytopenia 2. Gestational hypertension 3. R central vision loss/blurring 4. S>D at 38w   Admitting Diagnosis: suspected macrosomia  Secondary Diagnosis: Patient Active Problem List   Diagnosis Date Noted  . Post-term pregnancy, 40-42 weeks of gestation 03/03/2017  . Uterine size date discrepancy, third trimester 03/03/2017  . Hypertension in pregnancy 01/04/2017  . Ovarian cyst complicating pregnancy, antepartum 10/14/2016  . Maternal obesity, antepartum 10/13/2016  . Obesity (BMI 30.0-34.9) 08/23/2016  . History of thrombocytopenia 08/23/2016  . History of polyhydramnios 08/23/2016  . Supervision of other normal pregnancy, antepartum 08/23/2016  . Pap smear abnormality of cervix/human papillomavirus (HPV) positive 08/23/2016  . Prior fetal macrosomia, antepartum 08/23/2016  . Glucose intolerance of pregnancy 11/28/2015  . Thrombocytopenia ([place]) 09/20/2014    Augmentation: AROM and Pitocin Complications: [id number] Intrapartum complications/course:   31VQ G3P2002 at 40+1wks admitted for iol with concern for macrosomia, with a hx of a 10#14oz first baby by vaginal delivery without shoulder dystocia. She was 3cm on arrival and pitocin was started. After 12hrs without cervical change but frequent contractions, AROM for clear fluid. She progressed rapidly to fully dilated and pushed easily, delivering fetal head in an LOA position. Fetal shoulders followed promptly, and the baby was placed on the maternal abdomen. Delayed cord clamping x 60sec, but a large gush of bright red bleeding followed. The cord was quickly doubling clamped and cut and the placenta delivered spontaneously promptly, followed by  continued bright red bleeding. Fundal massage and bimanual compression of the LUS resulted in resolved bleeding. After iodine cleaning of uretheral opening, the bladder was drained for about 112ml of clear yellow urine, and thorough examination of the vagina and cervix revealed no lacerations. 0.2mg  of IM methergine was given after confirming blood pressure. Bimanual confirmed small clots in LUS and firm fundus, and bleeding returned to minimal.   PP plt 101K   Date of Delivery: 03/03/17 Delivered By: Benjaman Kindler, MD Delivery Type: spontaneous vaginal delivery Anesthesia: epidural Placenta: Spontaneous Laceration: none Episiotomy: none Newborn Data: Live born female  Birth Weight:4600 gm APGAR: 8,9  Results for orders placed or performed during the hospital encounter of 03/02/17 (from the past 24 hour(s))  CBC     Status: Abnormal   Collection Time: 03/04/17  4:23 AM  Result Value Ref Range   WBC 9.7 3.6 - 11.0 K/uL   RBC 4.56 3.80 - 5.20 MIL/uL   Hemoglobin 12.1 12.0 - 16.0 g/dL   HCT 36.6 35.0 - 47.0 %   MCV 80.1 80.0 - 100.0 fL   MCH 26.4 26.0 - 34.0 pg   MCHC 33.0 32.0 - 36.0 g/dL   RDW 16.8 (H) 11.5 - 14.5 %   Platelets 101 (L) 150 - 440 K/uL     Newborn Delivery   Birth date/time:  03/03/2017 19:22:00 Delivery type:  Vaginal, Spontaneous       Discharge Physical Exam: 03/04/2017  BP 120/67 (BP Location: Left Arm)   Pulse (!) 105   Temp 98.2 F (36.8 C) (Oral)   Resp 20   Ht 5\' 11"  (1.803 m)   Wt 105.7 kg (233 lb)   LMP 05/26/2016 (Exact Date)   SpO2 98%   BMI 32.50 kg/m   General: NAD CV: RRR Pulm: CTABL, nl effort ABD: s/nd/nt, fundus  firm and below the umbilicus Lochia: moderate  DVT Evaluation: LE non-ttp, no evidence of DVT on exam.  Hemoglobin  Date Value Ref Range Status  03/03/2017 12.2 12.0 - 16.0 g/dL Final  07/26/2016 13.3 11.1 - 15.9 g/dL Final   HCT  Date Value Ref Range Status  03/03/2017 37.4 35.0 - 47.0 % Final   Hematocrit   Date Value Ref Range Status  07/26/2016 39.8 34.0 - 46.6 % Final    Post partum course: uncomplicated  Postpartum Procedures: none Disposition: stable, discharge to home. Baby Feeding: breastmilk Baby Disposition: home with mom  Rh Immune globulin given: n/a Rubella vaccine given: n/a Tdap vaccine given in AP or PP setting: declined 12/08/16 Flu vaccine given in AP or PP setting: declined 01/17/17  Contraception: None  Prenatal Labs:   MBT A pos Ab screen neg Pap 07/06/16 Negative HPV + HIV Negative Hep B/RPR neg  Rubella immune VZV Immune Sickle: neg    Plan:  Marijean Montanye was discharged to home in good condition. Follow-up appointment at [place] with delivering doc in 6 weeks   Discharge Medications: Motrin 600 mg po q 6 hr prn     Signed: Laverta Baltimore  MD

## 2017-03-03 NOTE — Progress Notes (Signed)
Jessica Cantu is a 31 y.o. G3P2002 at [redacted]w[redacted]d  Subjective: Not feeling contractions, still not certain about AROM  Objective: BP 134/81   Pulse 78   Temp 97.9 F (36.6 C) (Oral)   Resp 18   Ht 5\' 11"  (1.803 m)   Wt 105.7 kg (233 lb)   LMP 05/26/2016 (Exact Date)   BMI 32.50 kg/m  I/O last 3 completed shifts: In: 731.4 [I.V.:731.4] Out: -  No intake/output data recorded.  FHT:  FHR: 120 bpm, variability: moderate,  accelerations:  Present,  decelerations:  Absent UC:   regular, every 3 minutes, on 81mu/pit SVE:   Dilation: 3 Effacement (%): 60 Station: -3 Exam by:: Leafy Ro, MD  Labs: Lab Results  Component Value Date   WBC 7.8 03/03/2017   HGB 12.2 03/03/2017   HCT 37.4 03/03/2017   MCV 79.0 (L) 03/03/2017   PLT 113 (L) 03/03/2017    Assessment / Plan:  No cervical change. We discussed AROM with FSE and contractions, as now on pitocin for 10.5hrs without cervical change. I do recommend AROM as a next step, and we discussed rare but possible cord prolapse, necessitating stat c/s. She could consider getting her epidural prior to attempted AROM. She will augment her pitocin with nipple stimulation for several hours and we will reassess.  FHT: CAT II   Benjaman Kindler 03/03/2017, 12:22 PM

## 2017-03-03 NOTE — Plan of Care (Addendum)
1900 - Care assumed from [place] - pt reports feeling increased pressure during contractions with urge to push.  1905 - SVE performed, ant lip 1907 - complete, trial push, descent noted 1914 - Dr Leafy Ro called to room for delivery 1916 - Pt positioned, pushing with contractions. 2nd RN Alexis at bedside to assist.   [phone] - delivery female infant. 1925 - delivery of placenta. Perineum intact, no laceration, no repair needed   1935 - Methergine 0.2mg  given in Lt thigh per MD order for heavy bleeding, EBL 800

## 2017-03-03 NOTE — Progress Notes (Signed)
Labor Progress Note  Jessica Cantu is a 31 y.o. G3P2002 at [redacted]w[redacted]d by LMP admitted for induction of labor due to term pregnancy with concern for fetal macrosomia.  Subjective: Pt resting comfortably in bed, husband sleeping at the bedside.   Objective: BP 137/81   Pulse 78   Temp 98.1 F (36.7 C) (Oral)   Resp 18   Ht 5\' 11"  (1.803 m)   Wt 105.7 kg (233 lb)   LMP 05/26/2016 (Exact Date)   BMI 32.50 kg/m   Notable VS details: elevated BPs in pregnancy, all WNL during admission  Fetal Assessment: FHT:  FHR: 130 bpm, variability: moderate,  accelerations:  Present,  decelerations:  Absent UC: 3 in 10 minutes SVE: Deferred at this time Category/reactivity: Category I Membrane status: intact  Labs: Lab Results  Component Value Date   WBC 7.8 03/03/2017   HGB 12.2 03/03/2017   HCT 37.4 03/03/2017   MCV 79.0 (L) 03/03/2017   PLT 113 (L) 03/03/2017    Assessment / Plan: IOL  Labor: Pitocin infusing at 50mu/min, RN titrating per protocol  Discussed performing SVE, deferred at this time. Encouraged available movement out of bed. Pt voiced interest in using birth ball in a little bit.  Fetal Wellbeing:   Category I Pain Control: Pt currently rates pain with contractions 4-5/10, states that she will want an epidural placed when she is at a 8-9.   Lisette Grinder, CNM 03/03/2017, 6:26 AM

## 2017-03-03 NOTE — Anesthesia Procedure Notes (Signed)
Epidural Patient location during procedure: OB Start time: 03/03/2017 4:45 PM End time: 03/03/2017 4:48 PM  Staffing Anesthesiologist: Gunnar Fusi, MD Resident/CRNA: Doreen Salvage, CRNA Performed: resident/CRNA   Preanesthetic Checklist Completed: patient identified, site marked, surgical consent, pre-op evaluation, timeout performed, IV checked, risks and benefits discussed and monitors and equipment checked  Epidural Patient position: sitting Prep: Betadine Patient monitoring: heart rate, continuous pulse ox and blood pressure Approach: midline Location: L4-L5 Injection technique: LOR saline  Needle:  Needle type: Tuohy  Needle gauge: 17 G Needle length: 9 cm and 9 Needle insertion depth: 6 cm Catheter type: closed end flexible Catheter size: 19 Gauge Catheter at skin depth: 11 cm Test dose: negative and 1.5% lidocaine with Epi 1:200 K  Assessment Sensory level: T10 Events: blood not aspirated, injection not painful, no injection resistance, negative IV test and no paresthesia  Additional Notes Pt. Evaluated and documentation done after procedure finished. Patient identified. Risks/Benefits/Options discussed with patient including but not limited to bleeding, infection, nerve damage, paralysis, failed block, incomplete pain control, headache, blood pressure changes, nausea, vomiting, reactions to medication both or allergic, itching and postpartum back pain. Confirmed with bedside nurse the patient's most recent platelet count. Confirmed with patient that they are not currently taking any anticoagulation, have any bleeding history or any family history of bleeding disorders. Patient expressed understanding and wished to proceed. All questions were answered. Sterile technique was used throughout the entire procedure. Please see nursing notes for vital signs. Test dose was given through epidural catheter and negative prior to continuing to dose epidural or start infusion.  Warning signs of high block given to the patient including shortness of breath, tingling/numbness in hands, complete motor block, or any concerning symptoms with instructions to call for help. Patient was given instructions on fall risk and not to get out of bed. All questions and concerns addressed with instructions to call with any issues or inadequate analgesia.   Patient tolerated the insertion well without immediate complications.Reason for block:procedure for pain

## 2017-03-03 NOTE — H&P (Signed)
OB History & Physical   History of Present Illness:  Chief Complaint: Presents for IOL  HPI:  Yara Tomkinson is a 31 y.o. G2P2002 female at [redacted]w[redacted]d dated by LMP c/w 8w U/s.  She presents to L&D for planned induction of labor due to term pregnancy and concern for fetal macrosomia.   Reports good fetal movement, no LOF, no VB, and irregular contractions.   Pregnancy Issues: 1. Thrombocytopenia 2. Gestational hypertension 3. R central vision loss/blurring 4. S>D at 38w  Maternal Medical History:   Past Medical History:  Diagnosis Date  . 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 Fibrocystic breast changes   . Gestational thrombocytopenia ([place])   . H/O gastroesophageal reflux (GERD)   . 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 TONSILLECTOMY      No Known Allergies  Prior to Admission medications   Medication Sig Start Date End Date Taking? Authorizing Provider  Prenatal Vit-Fe Fumarate-FA (PRENATAL MULTIVITAMIN) TABS tablet Take 1 tablet by mouth daily at 12 noon.   Yes [provider]     Prenatal care site: [place] History: She  reports that  has never smoked. she has never used smokeless tobacco. She reports that she drinks alcohol. She reports that she does not use drugs.  Family History: family history includes Cancer in her paternal grandfather; Diabetes in her maternal grandfather, maternal grandmother, paternal grandfather, and paternal grandmother; Hypertension in her father and maternal grandmother.   Review of Systems: A full review of systems was performed and negative except as noted in the HPI.     Physical Exam:  Vital Signs: BP 128/74   Pulse 91   Temp 98.1 F  (36.7 C) (Oral)   Resp 16   Ht 5\' 11"  (1.803 m)   Wt 105.7 kg (233 lb)   LMP 05/26/2016 (Exact Date)   BMI 32.50 kg/m   General:   alert, cooperative, appears stated age and no distress  Skin:  no rash or abnormalities  Neurologic:    Alert & oriented x 3  Lungs:   clear to auscultation bilaterally  Heart:   regular rate and rhythm, S1, S2 normal, no murmur, click, rub or gallop  Abdomen:  normal findings: soft, non-tender and gravid  FHT:  120s BPM by EFM  Presentations: cephalic  Cervix:    Dilation: 3cm   Effacement: 60%   Station:  -3   Consistency: soft   Position: posterior  Extremities: : non-tender, symmetric, no edema bilaterally.    EFW: 8.5lb  Results for orders placed or performed during the hospital encounter of 03/02/17 (from the past 24 hour(s))  CBC     Status: Abnormal   Collection Time: 03/03/17 12:08 AM  Result Value Ref Range   WBC 7.8 3.6 - 11.0 K/uL   RBC 4.74 3.80 - 5.20 MIL/uL   Hemoglobin 12.2 12.0 - 16.0 g/dL   HCT 37.4 35.0 - 47.0 %   MCV 79.0 (L) 80.0 - 100.0 fL   MCH 25.7 (L) 26.0 - 34.0 pg   MCHC 32.5 32.0 - 36.0 g/dL   RDW 16.2 (H) 11.5 - 14.5 %   Platelets 113 (L) 150 -  440 K/uL  Type and screen     Status: None (Preliminary result)   Collection Time: 03/03/17 12:08 AM  Result Value Ref Range   ABO/RH(D) PENDING    Antibody Screen PENDING    Sample Expiration 03/06/2017     Pertinent Results:  Prenatal Labs: Blood type/Rh A+  Antibody screen neg  Rubella Immune  Varicella Immune  RPR NR  HBsAg Neg  HIV NR  GC neg  Chlamydia neg  Genetic screening declined  1 hour GTT 86  3 hour GTT n/a  GBS neg   FHT: baseline 125bpm, moderate variability, 15x15 accels present TOCO: 1-2 contractions/10 minutes SVE:  Dilation: 3 / Effacement (%): 60 / Station: -3    Cephalic by leopolds   Assessment:  Yarelli Decelles is a 31 y.o. G67P2002 female at [redacted]w[redacted]d with gestational thrombocytopenia and gestational hypertension, who present for  IOL.  Plan:  1. Admit to Labor & Delivery 2. CBC, T&S, CMP, P/C ratio, Clrs, IVF 3. Consents obtained 4. Continuous EFM/toco 5. Discussed options for induction. After reviewing indications, risks, and benefits, pt opted to proceed with Pitocin only at this point.   ----- Lisette Grinder, CNM Certified Nurse Midwife [facility], Department of [facility]

## 2017-03-03 NOTE — Progress Notes (Signed)
Tracing Note  Jessica Cantu is a 31 y.o. G3P2002 at [redacted]w[redacted]d by LMP admitted for induction of labor due to term pregnancy with concern for fetal macrosomia.  Objective: BP 128/74   Pulse 91   Temp 98.1 F (36.7 C) (Oral)   Resp 16   Ht 5\' 11"  (1.803 m)   Wt 105.7 kg (233 lb)   LMP 05/26/2016 (Exact Date)   BMI 32.50 kg/m  Notable VS details: mild range BPs in pregnancy  Fetal Assessment: FHT:  FHR: 130 bpm, variability: moderate,  accelerations:  Present,  decelerations:  Absent  Category/reactivity: Category I UC:   1-2/10 minutes SVE:  Deferred at this time Membrane status: intact  Labs: Lab Results  Component Value Date   WBC 7.8 03/03/2017   HGB 12.2 03/03/2017   HCT 37.4 03/03/2017   MCV 79.0 (L) 03/03/2017   PLT 113 (L) 03/03/2017    Assessment / Plan: IOL d/t term with concern for fetal macrosomia   Labor: Pitocin initated, RN will titrate according to protocl Fetal Wellbeing:  Category I Pain Control:  Comfortable now, plans epidural for later in labor, husband at the bedside for support Anticipated MOD:  NSVD  Lisette Grinder 03/03/2017, 2:48 AM

## 2017-03-03 NOTE — Progress Notes (Signed)
Careful AROM with FSE during contraction, baby with bulging bag and pressing against cervix. Copious clear fluid released without complication with patient's permission.

## 2017-03-03 NOTE — Anesthesia Preprocedure Evaluation (Signed)
Anesthesia Evaluation  Patient identified by MRN, date of birth, ID band Patient awake    Reviewed: Allergy & Precautions, H&P , NPO status , Patient's Chart, lab work & pertinent test results  Airway Mallampati: II  TM Distance: >3 FB Neck ROM: full    Dental no notable dental hx.    Pulmonary    Pulmonary exam normal        Cardiovascular hypertension, Normal cardiovascular exam     Neuro/Psych negative neurological ROS  negative psych ROS   GI/Hepatic Neg liver ROS, GERD  ,  Endo/Other  negative endocrine ROS  Renal/GU negative Renal ROS  negative genitourinary   Musculoskeletal   Abdominal   Peds  Hematology negative hematology ROS (+)   Anesthesia Other Findings   Reproductive/Obstetrics (+) Pregnancy                             Anesthesia Physical Anesthesia Plan  ASA: II  Anesthesia Plan: Epidural   Post-op Pain Management:    Induction:   PONV Risk Score and Plan:   Airway Management Planned:   Additional Equipment:   Intra-op Plan:   Post-operative Plan:   Informed Consent: I have reviewed the patients History and Physical, chart, labs and discussed the procedure including the risks, benefits and alternatives for the proposed anesthesia with the patient or authorized representative who has indicated his/her understanding and acceptance.     Plan Discussed with: Anesthesiologist  Anesthesia Plan Comments:         Anesthesia Quick Evaluation

## 2017-03-03 NOTE — Plan of Care (Signed)
Discussed plan of care with patient. Patient verbalized understanding.

## 2017-03-03 NOTE — Progress Notes (Signed)
Labor Progress Note  Jessica Cantu a 42 y.D.G6Y4034 at [redacted]w[redacted]d by LMPadmitted for induction of labor due toterm pregnancy with concern for fetal macrosomia.  Subjective: Pt getting up and out of bed to sit on birth ball. Wireless monitor attached. Coping well with contractions. Husband present for support.   Objective: BP 134/81 (BP Location: Left Arm)   Pulse 77   Temp 97.9 F (36.6 C) (Oral)   Resp 18   Ht 5\' 11"  (1.803 m)   Wt 105.7 kg (233 lb)   LMP 05/26/2016 (Exact Date)   BMI 32.50 kg/m  Notable VS details: elevated BPs in pregnancy, all WNL during admission  Fetal Assessment:: FHT:  FHR: 135 bpm, variability: moderate,  accelerations:  Present,  decelerations:  Absent Category/reactivity: Category I, reassuring UC:   3-4/10 minutes SVE:   Dilation: 3 Effacement (%): 60 Station: -3 Exam by:: Joni Fears, CNM Membrane status: intact  Labs: Lab Results  Component Value Date   WBC 7.8 03/03/2017   HGB 12.2 03/03/2017   HCT 37.4 03/03/2017   MCV 79.0 (L) 03/03/2017   PLT 113 (L) 03/03/2017    Assessment / Plan: Induction of labor with pitocin  Labor:  -Pitocin infusing at 48mu/min, RN titrating per protocol -SVE 3/60/-3 Fetal Wellbeing:  -Category I Maternal wellbeing:   -Pain currently around 5/10, pt states that contractions are still very tolerable.  -Up on birth ball, encouraged to move around. Discussed use of peanut ball later in labor once epidural has been placed. -Pt prefers no extra people in the room throughout labor and delivery (students, etc). Reassured that this will be shared with covering provider.    Lisette Grinder, CNM 03/03/2017, 7:50 AM

## 2017-03-03 NOTE — Progress Notes (Signed)
Jessica Cantu is a 31 y.o. G3P2002 at 105w1d by LMP consistent with an 8 wk ultrasound admitted for induction of labor due to macrosomia with a hx of macrosomia without shoulder dystocia. Pt 3/60 on arrival, declined prostaglandins and mechanical cervical ripening, and is now on a pitocin of 66mu/min.  Subjective: Pt comfortable, feeling contractions occasionally.  Objective: BP 134/81   Pulse 78   Temp 97.9 F (36.6 C) (Oral)   Resp 18   Ht 5\' 11"  (1.803 m)   Wt 105.7 kg (233 lb)   LMP 05/26/2016 (Exact Date)   BMI 32.50 kg/m  I/O last 3 completed shifts: In: 731.4 [I.V.:731.4] Out: -  No intake/output data recorded.  FHT:  FHR: 145 bpm, variability: moderate,  accelerations:  Present,  decelerations:  Absent UC:   regular, every 3-7 minutes SVE:   Dilation: 3 Effacement (%): 60 Station: -3 Exam by:: Joni Fears, CNM  Labs: Lab Results  Component Value Date   WBC 7.8 03/03/2017   HGB 12.2 03/03/2017   HCT 37.4 03/03/2017   MCV 79.0 (L) 03/03/2017   PLT 113 (L) 03/03/2017    Assessment / Plan: Induction of labor due to macrosomia, on pitocin. We discussed if no change with ctx q2-3 min for several hours, reconsider AROM.  Labor: Progressing on Pitocin, will continue to increase then AROM Preeclampsia:  labs stable, elevated BP at new OB, so may have underlying chronic HTN, but labs normal and BP below mild range.  Fetal Wellbeing:  Category I Pain Control:  Labor support without medications, Epidural and we discussed other options Anticipated MOD:  NSVD  This a baby boy, her third. She is planning to breastfeed. I have not discussed contraception with her, and we don't have it on the chart. After delivery it can be readdressed.  Benjaman Kindler 03/03/2017, 8:31 AM

## 2017-03-04 LAB — CBC
HEMATOCRIT: 36.6 % (ref 35.0–47.0)
HEMOGLOBIN: 12.1 g/dL (ref 12.0–16.0)
MCH: 26.4 pg (ref 26.0–34.0)
MCHC: 33 g/dL (ref 32.0–36.0)
MCV: 80.1 fL (ref 80.0–100.0)
Platelets: 101 10*3/uL — ABNORMAL LOW (ref 150–440)
RBC: 4.56 MIL/uL (ref 3.80–5.20)
RDW: 16.8 % — ABNORMAL HIGH (ref 11.5–14.5)
WBC: 9.7 10*3/uL (ref 3.6–11.0)

## 2017-03-04 LAB — RPR: RPR Ser Ql: NONREACTIVE

## 2017-03-04 MED ORDER — IBUPROFEN 600 MG PO TABS: 600.0000 mg | ORAL_TABLET | Freq: Four times a day (QID) | ORAL | 0 refills | Status: DC

## 2017-03-04 NOTE — Anesthesia Postprocedure Evaluation (Signed)
Anesthesia Post Note  Patient: Jessica Cantu  Procedure(s) Performed: AN AD [place]  Patient location during evaluation: Mother Baby Anesthesia Type: Epidural Level of consciousness: awake and alert Pain management: pain level controlled Vital Signs Assessment: post-procedure vital signs reviewed and stable Respiratory status: spontaneous breathing, nonlabored ventilation and respiratory function stable Cardiovascular status: stable Postop Assessment: no headache, no backache and epidural receding Anesthetic complications: no     Last Vitals:  Vitals:   03/04/17 0358 03/04/17 0401  BP: (!) 150/88 134/88  Pulse: 87 86  Resp: 16   Temp: 36.6 C   SpO2: 99%     Last Pain:  Vitals:   03/04/17 0455  TempSrc:   PainSc: 0-No pain                 Alison Stalling

## 2017-03-04 NOTE — Progress Notes (Signed)
All discharge instructions given to patient and she voices understanding of all instructions given. She will make her own f/u appt. Patient discharged home with spouse escorted out by cna

## 2017-03-04 NOTE — Lactation Note (Signed)
This note was copied from a baby's chart. Lactation Consultation Note  Patient Name: Jessica Cantu [id number] Date: 03/04/2017 Reason for consult: Follow-up assessment   Maternal Data Formula Feeding for Exclusion: No Does the patient have breastfeeding experience prior to this delivery?: Yes  Feeding Feeding Type: (did not observe a feeding) Mother states baby is latching and nursing well, every 2-3 hrs, breast fed 2 other children who are 1 and 2 yrs old.   LATCH Score                   Interventions  Mom has no questions or concerns  Lactation Tools Discussed/Used WIC Program: No   Consult Status Consult Status: PRN    Ferol Luz 03/04/2017, 5:17 PM

## 2017-03-04 NOTE — Discharge Instructions (Signed)
Please call your doctor or return to the ER if you experience any chest pains, shortness of breath, dizziness, visual changes, fever greater than 101, any heavy bleeding (saturating more than 1 pad per hour), large clots, or foul smelling discharge, any worsening abdominal pain and cramping that is not controlled by pain medication, or any signs of postpartum depression. No tampons, enemas, douches, or sexual intercourse for 6 weeks. Also avoid tub baths, hot tubs, or swimming for 6 weeks.  °

## 2017-04-20 ENCOUNTER — Other Ambulatory Visit: Payer: Self-pay | Admitting: Unknown Physician Specialty

## 2017-04-20 DIAGNOSIS — H9120 Sudden idiopathic hearing loss, unspecified ear: Secondary | ICD-10-CM

## 2017-04-26 ENCOUNTER — Ambulatory Visit
Admission: RE | Admit: 2017-04-26 | Discharge: 2017-04-26 | Disposition: A | Discharge status: Home / Self Care | Payer: Commercial Managed Care - PPO | Source: Ambulatory Visit | Attending: Unknown Physician Specialty | Admitting: Unknown Physician Specialty

## 2017-04-26 DIAGNOSIS — H9120 Sudden idiopathic hearing loss, unspecified ear: Secondary | ICD-10-CM

## 2017-05-04 ENCOUNTER — Ambulatory Visit
Admission: RE | Admit: 2017-05-04 | Discharge: 2017-05-04 | Disposition: A | Discharge status: Home / Self Care | Payer: Commercial Managed Care - PPO | Source: Ambulatory Visit | Attending: Unknown Physician Specialty | Admitting: Unknown Physician Specialty

## 2017-05-04 DIAGNOSIS — H9120 Sudden idiopathic hearing loss, unspecified ear: Secondary | ICD-10-CM | POA: Insufficient documentation

## 2017-05-04 MED ORDER — GADOBENATE DIMEGLUMINE 529 MG/ML IV SOLN
20.0000 mL | Freq: Once | INTRAVENOUS | Status: AC | PRN
Start: 2017-05-04 — End: 2017-05-04
  Administered 2017-05-04: 20 mL via INTRAVENOUS

## 2018-04-19 IMAGING — MR MR BRAIN/IAC WO/W
11 of 12 series · 41 of 48 positions shown · IV contrast (multihance)
Comparison: None.

CLINICAL DATA: Bilateral hearing loss.  Sudden onset.

EXAM:
MRI HEAD WITHOUT AND WITH CONTRAST
TECHNIQUE: Multiplanar, multiecho pulse sequences of the brain and surrounding
structures were obtained without and with intravenous contrast.
CONTRAST:  20mL MULTIHANCE GADOBENATE DIMEGLUMINE 529 MG/ML IV SOLN

[Series 3: DWI · axial · 3.0mm · 1.20mm/px · z∈[-68,+93]mm · 6 of 55 slices shown (1 of 2)]
[im 1/55]
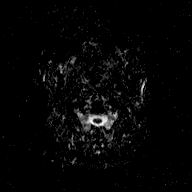
[im 11/55]
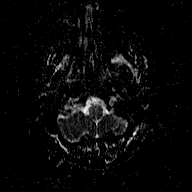
[im 22/55]
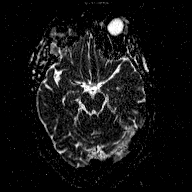
[im 33/55]
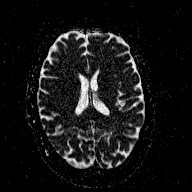
[im 44/55]
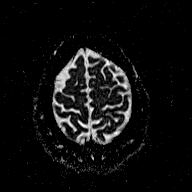
[im 55/55]
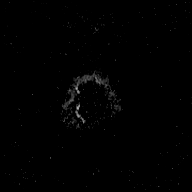

[Series 4: T1 · sagittal · 5.0mm · 0.45mm/px · 3 of 23 slices shown (1 of 3)]
[im 1/23]
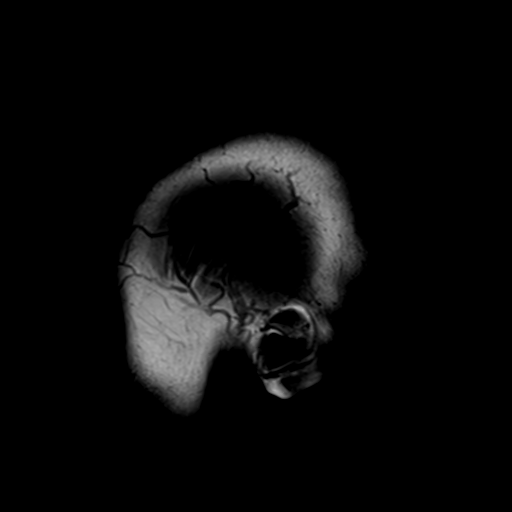
[im 12/23]
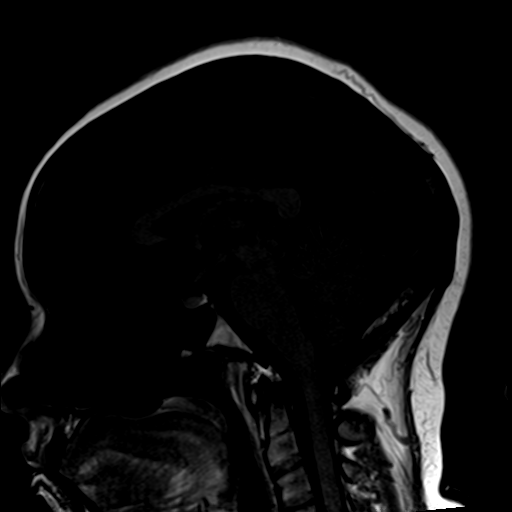
[im 23/23]
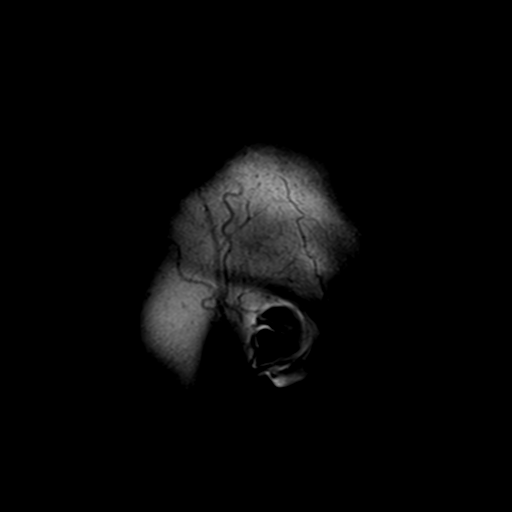

[Series 5: T2 · axial · 5.0mm · 0.90mm/px · z∈[-71,+84]mm · 3 of 25 slices shown (1 of 2)]
[im 1/25]
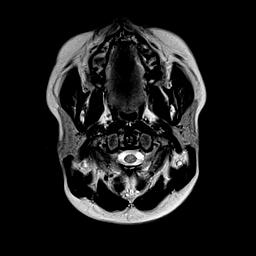
[im 13/25]
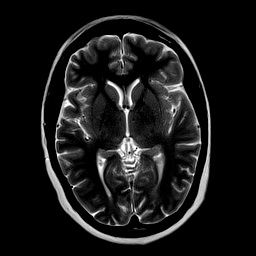
[im 25/25]
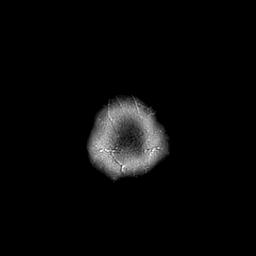

[Series 6: T2 · axial · 5.0mm · 0.72mm/px · z∈[-73,+87]mm · 3 of 24 slices shown (2 of 2)]
[im 1/24]
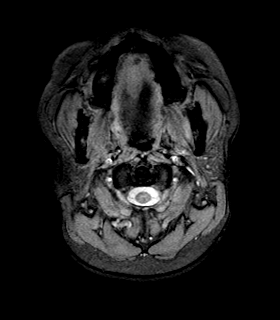
[im 12/24]
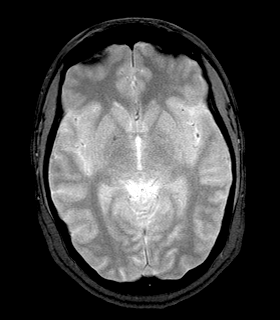
[im 24/24]
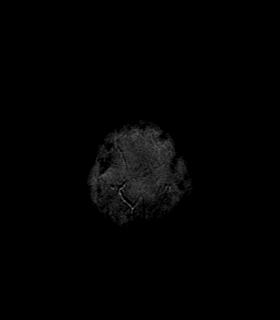

[Series 7: FLAIR · axial · 3.0mm · 0.45mm/px · z∈[-73,+88]mm · 7 of 55 slices shown]
[im 1/55]
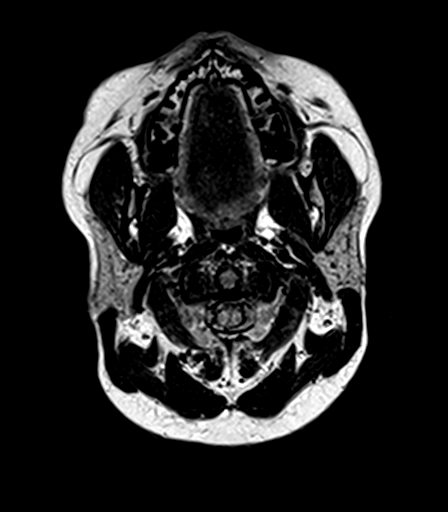
[im 10/55]
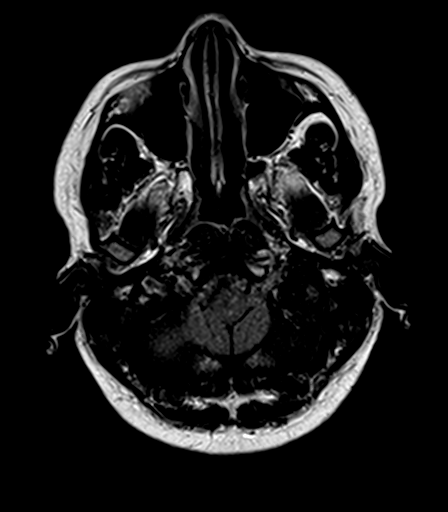
[im 19/55]
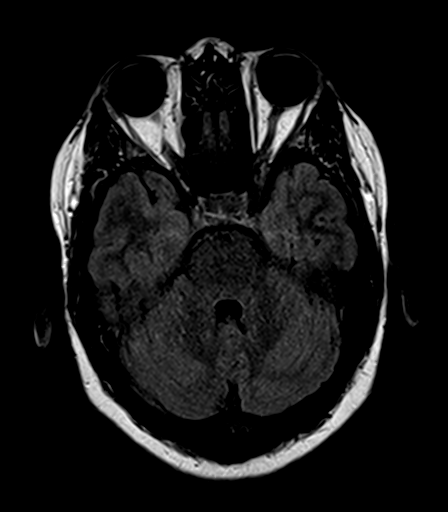
[im 28/55]
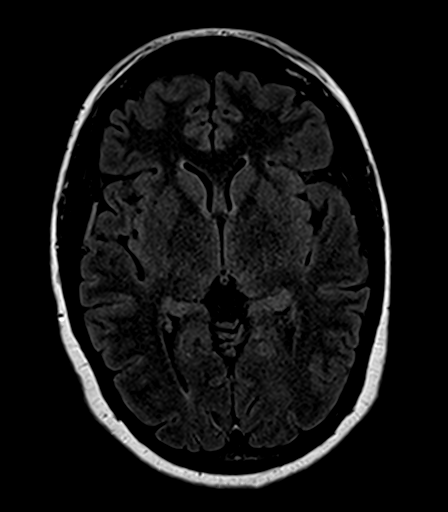
[im 37/55]
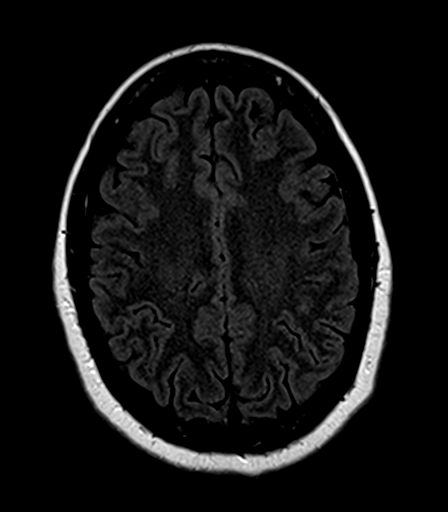
[im 46/55]
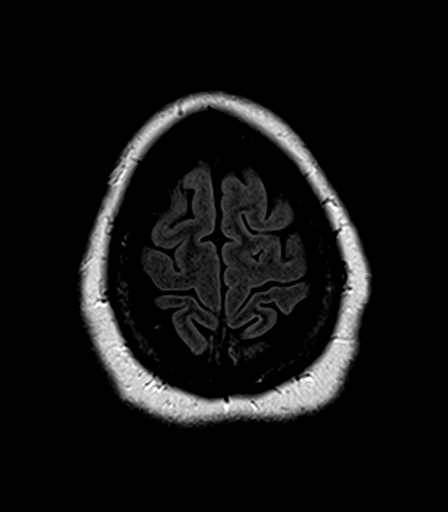
[im 55/55]
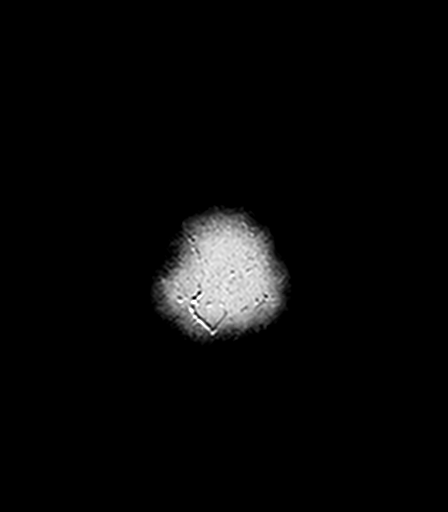

[Series 8: T1 · coronal · 3.0mm · 0.37mm/px · 1 of 11 slices shown (2 of 3)]
[im 1/11]
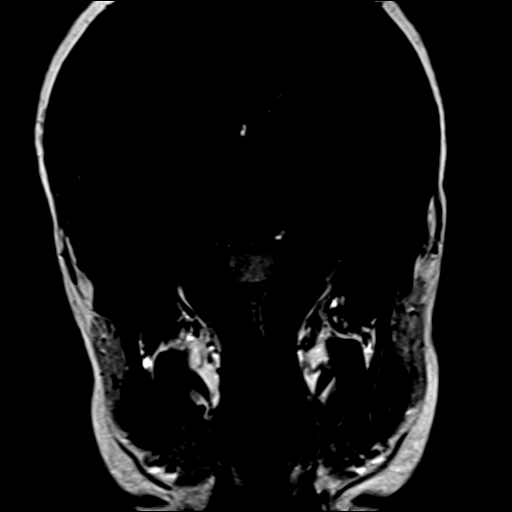

[Series 9: T1 · axial · 3.0mm · 0.37mm/px · 1 of 11 slices shown (3 of 3)]
[im 1/11]
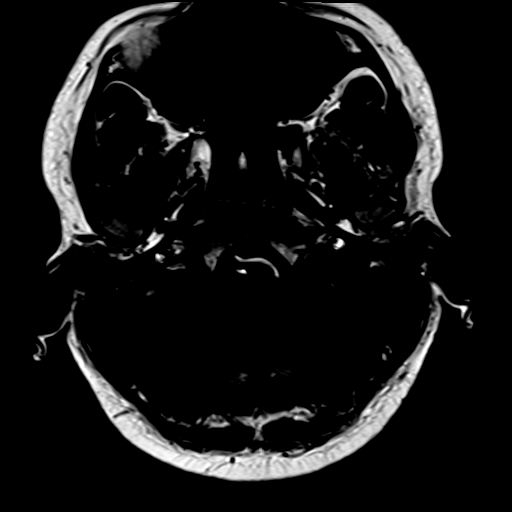

[Series 11: T1 post-contrast · axial · 3.0mm · 0.37mm/px · 1 of 11 slices shown (1 of 3)]
[im 1/11]
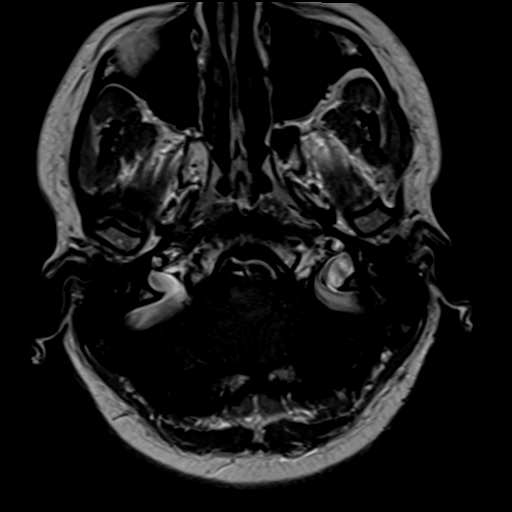

[Series 12: T1 post-contrast · coronal · 3.0mm · 0.37mm/px · 1 of 11 slices shown (2 of 3)]
[im 1/11]
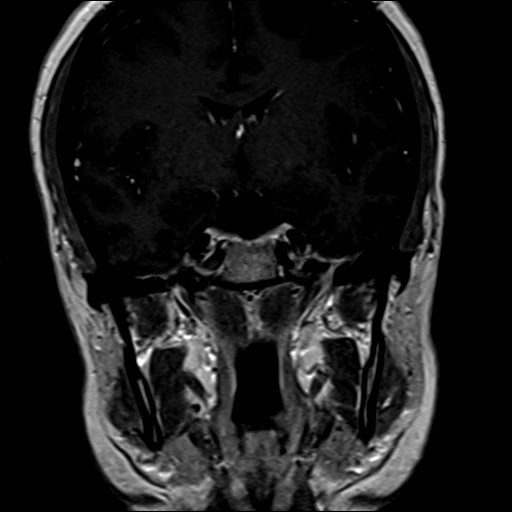

[Series 13: T1 post-contrast · axial · 3.0mm · 1.00mm/px · z∈[-87,+101]mm · 8 of 64 slices shown (3 of 3)]
[im 1/64]
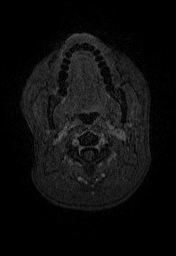
[im 10/64]
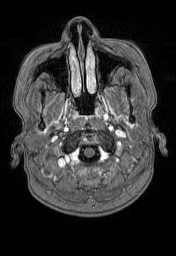
[im 19/64]
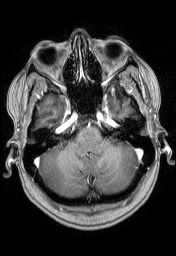
[im 28/64]
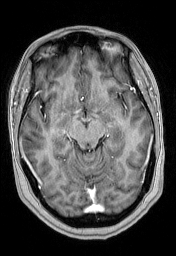
[im 37/64]
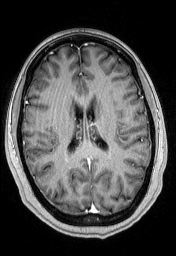
[im 46/64]
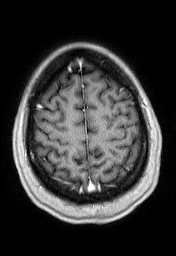
[im 55/64]
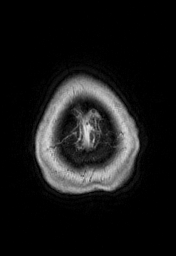
[im 64/64]
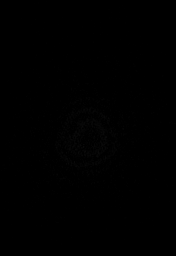

[Series 100: DWI · axial · 3.0mm · 1.20mm/px · z∈[-68,+93]mm · 7 of 55 slices shown (2 of 2)]
[im 1/55]
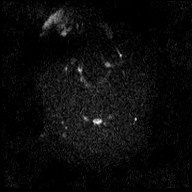
[im 10/55]
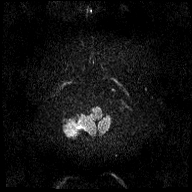
[im 19/55]
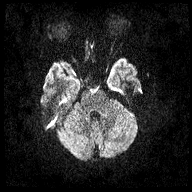
[im 28/55]
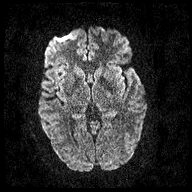
[im 37/55]
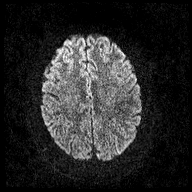
[im 46/55]
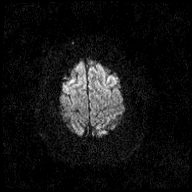
[im 55/55]
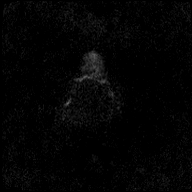

[41 of 48 positions shown; findings below may reference images not displayed]

FINDINGS: Brain: No acute infarct, hemorrhage, or mass lesion is present. The
ventricles are of normal size.

No significant white matter disease is present. Brainstem and
cerebellum are normal. No significant extraaxial fluid collection is
present.

The postcontrast images demonstrate no pathologic enhancement.

Vascular: Flow is present in the major intracranial arteries.

Skull and upper cervical spine: The craniocervical junction is
within normal limits. Midline sagittal structures are unremarkable.

Sinuses/Orbits: The paranasal sinuses and mastoid air cells are
clear. Globes and orbits are within normal limits.

Other: Dedicated imaging of the internal auditory canals bilaterally
demonstrates no pathologic enhancement.

The seventh and eighth cranial nerves are discretely visualized.
There is no mass lesion. The inner ear structures are normally
formed.
IMPRESSION: 1. Normal MRI the brain and IAC's without acute or focal abnormality
to explain bilateral hearing loss.

## 2019-11-30 ENCOUNTER — Other Ambulatory Visit: Payer: Self-pay

## 2019-11-30 ENCOUNTER — Inpatient Hospital Stay: Payer: Commercial Managed Care - PPO

## 2019-11-30 ENCOUNTER — Inpatient Hospital Stay: Payer: Commercial Managed Care - PPO | Attending: Oncology | Admitting: Oncology

## 2019-11-30 ENCOUNTER — Encounter: Payer: Self-pay | Admitting: Oncology

## 2019-11-30 VITALS — BP 141/84 | HR 89 | Temp 97.5°F | Resp 20 | Wt 236.1 lb

## 2019-11-30 DIAGNOSIS — E669 Obesity, unspecified: Secondary | ICD-10-CM | POA: Diagnosis not present

## 2019-11-30 DIAGNOSIS — R5383 Other fatigue: Secondary | ICD-10-CM | POA: Diagnosis not present

## 2019-11-30 DIAGNOSIS — K219 Gastro-esophageal reflux disease without esophagitis: Secondary | ICD-10-CM | POA: Diagnosis not present

## 2019-11-30 DIAGNOSIS — Z79899 Other long term (current) drug therapy: Secondary | ICD-10-CM | POA: Insufficient documentation

## 2019-11-30 DIAGNOSIS — R531 Weakness: Secondary | ICD-10-CM | POA: Insufficient documentation

## 2019-11-30 DIAGNOSIS — D509 Iron deficiency anemia, unspecified: Secondary | ICD-10-CM | POA: Insufficient documentation

## 2019-11-30 DIAGNOSIS — D696 Thrombocytopenia, unspecified: Secondary | ICD-10-CM | POA: Diagnosis not present

## 2019-11-30 NOTE — Progress Notes (Signed)
Hematology/Oncology Consult note [facility] [phone] [phone]  Patient Care Team: Patient, No Pcp Per as PCP - General (General Practice)   Name of the patient: Jessica Cantu  [id number]  [birth year]    Reason for referral-iron deficiency anemia   Referring physician-Dr. Vikki Ports Ward  Date of visit: 11/30/19   History of presenting illness- Patient is a 34 year old African-American female G3, P3 L3 referred for iron deficiency anemia.  Her most recent CBC on 11/27/2019 showed white count of 6.4, H&H of 9.1/33.5 with an MCV of 68.2 and a platelet count of 243.  Ferritin levels were low at 16 and iron studies showed an elevated TIBC of 515.  B12 levels were normal.  A month prior to that her H&H was 7.8/28.4.  Patient admits to having heavy menstrual cycles after the birth of her third child and she has seen Dr. Leonides Schanz for this.  She is hesitant to start any hormonal contraceptives to regulate her menstrual cycles.  She is also contemplating if she needs to go through [facility] when she goes for her dermoid cyst procedure.  She denies any consistent use of NSAIDs.  Denies any family history of colon cancer.  Patient admits to taking oral iron for the last couple of weeks which is tolerating well without any significant side effects.  Patient reports ongoing fatigue  ECOG PS- 0   Review of systems- Review of Systems  Constitutional: Positive for malaise/fatigue. Negative for chills, fever and weight loss.  HENT: Negative for congestion, ear discharge and nosebleeds.   Eyes: Negative for blurred vision.  Respiratory: Negative for cough, hemoptysis, sputum production, shortness of breath and wheezing.   Cardiovascular: Negative for chest pain, palpitations, orthopnea and claudication.  Gastrointestinal: Negative for abdominal pain, blood in stool, constipation, diarrhea, heartburn, melena, nausea and vomiting.  Genitourinary: Negative for dysuria, flank  pain, frequency, hematuria and urgency.  Musculoskeletal: Negative for back pain, joint pain and myalgias.  Skin: Negative for rash.  Neurological: Negative for dizziness, tingling, focal weakness, seizures, weakness and headaches.  Endo/Heme/Allergies: Does not bruise/bleed easily.  Psychiatric/Behavioral: Negative for depression and suicidal ideas. The patient does not have insomnia.     No Known Allergies  Patient Active Problem List   Diagnosis Date Noted  . Post-term pregnancy, 40-42 weeks of gestation 03/03/2017  . Uterine size date discrepancy, third trimester 03/03/2017  . Hypertension in pregnancy 01/04/2017  . Ovarian cyst complicating pregnancy, antepartum 10/14/2016  . Maternal obesity, antepartum 10/13/2016  . Obesity (BMI 30.0-34.9) 08/23/2016  . History of thrombocytopenia 08/23/2016  . History of polyhydramnios 08/23/2016  . Supervision of other normal pregnancy, antepartum 08/23/2016  . Pap smear abnormality of cervix/human papillomavirus (HPV) positive 08/23/2016  . Prior fetal macrosomia, antepartum 08/23/2016  . Glucose intolerance of pregnancy 11/28/2015  . Thrombocytopenia ([place]) 09/20/2014     Past Medical History:  Diagnosis Date  . 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 Fibrocystic breast changes   . Gestational thrombocytopenia ([place])   . H/O gastroesophageal reflux (GERD)   . Pap smear of cervix shows high risk HPV present 12/2014   12/2014: NILM, HPV 18+; 02/2016: NILM, HPV neg; 06/2016: NILM, HR HPV+, declined colpo in pregnancy     Past Surgical History:  Procedure Laterality Date  . TONSILLECTOMY      Social History   Socioeconomic History  . Marital status:  Married    Spouse name: Not on file  . Number of children: Not on file  . Years of education: Not on file  . Highest education level: Not on file  Occupational History  . Not on file    Tobacco Use  . Smoking status: Never Smoker  . Smokeless tobacco: Never Used  Substance and Sexual Activity  . Alcohol use: Yes    Comment: social  . Drug use: No  . Sexual activity: Yes    Partners: Male    Birth control/protection: None  Other Topics Concern  . Not on file  Social History Narrative  . Not on file   Social Determinants of Health   Financial Resource Strain:   . Difficulty of Paying Living Expenses:   Food Insecurity:   . Worried About Charity fundraiser in the Last Year:   . Arboriculturist in the Last Year:   Transportation Needs:   . Film/video editor (Medical):   Marland Kitchen Lack of Transportation (Non-Medical):   Physical Activity:   . Days of Exercise per Week:   . Minutes of Exercise per Session:   Stress:   . Feeling of Stress :   Social Connections:   . Frequency of Communication with Friends and Family:   . Frequency of Social Gatherings with Friends and Family:   . Attends Religious Services:   . Active Member of Clubs or Organizations:   . Attends Archivist Meetings:   Marland Kitchen Marital Status:   Intimate Partner Violence:   . Fear of Current or Ex-Partner:   . Emotionally Abused:   Marland Kitchen Physically Abused:   . Sexually Abused:      Family History  Problem Relation Age of Onset  . Hypertension Father   . Diabetes Maternal Grandmother   . Hypertension Maternal Grandmother   . Diabetes Maternal Grandfather   . Diabetes Paternal Grandmother   . Diabetes Paternal Grandfather   . Cancer Paternal Grandfather      Current Outpatient Medications:  .  calcium carbonate (TUMS - DOSED IN MG ELEMENTAL CALCIUM) 500 MG chewable tablet, Chew 1 tablet as needed by mouth for indigestion or heartburn., Disp: , Rfl:  .  ergocalciferol (VITAMIN D2) 1.25 MG (50000 UT) capsule, Take by mouth., Disp: , Rfl:  .  hydrocortisone (ANUSOL-HC) 2.5 % rectal cream, Place rectally., Disp: , Rfl:  .  ibuprofen (ADVIL,MOTRIN) 600 MG tablet, Take 1 tablet (600 mg  total) every 6 (six) hours by mouth., Disp: 30 tablet, Rfl: 0 .  Vitamin D, Ergocalciferol, (DRISDOL) 1.25 MG (50000 UNIT) CAPS capsule, Take 50,000 Units by mouth once a week., Disp: , Rfl:  .  Prenatal Vit-Fe Fumarate-FA (PRENATAL MULTIVITAMIN) TABS tablet, Take 1 tablet by mouth daily at 12 noon., Disp: , Rfl:    Physical exam:  Vitals:   11/30/19 1002  BP: (!) 141/84  Pulse: 89  Resp: 20  Temp: (!) 97.5 F (36.4 C)  SpO2: 100%  Weight: 236 lb 1.6 oz (107.1 kg)   Physical Exam Constitutional:      General: She is not in acute distress. Cardiovascular:     Rate and Rhythm: Normal rate and regular rhythm.     Heart sounds: Normal heart sounds.  Pulmonary:     Effort: Pulmonary effort is normal.     Breath sounds: Normal breath sounds.  Abdominal:     General: Bowel sounds are normal.     Palpations: Abdomen is soft.  Skin:  General: Skin is warm and dry.  Neurological:     Mental Status: She is alert and oriented to person, place, and time.        CMP Latest Ref Rng & Units 03/03/2017  Glucose 65 - 99 mg/dL 98  BUN 6 - 20 mg/dL <5(L)  Creatinine 0.44 - 1.00 mg/dL 0.47  Sodium 135 - 145 mmol/L 137  Potassium 3.5 - 5.1 mmol/L 3.8  Chloride 101 - 111 mmol/L 104  CO2 22 - 32 mmol/L 22  Calcium 8.9 - 10.3 mg/dL 8.6(L)  Total Protein 6.5 - 8.1 g/dL 6.8  Total Bilirubin 0.3 - 1.2 mg/dL 0.1(L)  Alkaline Phos 38 - 126 U/L 187(H)  AST 15 - 41 U/L 33  ALT 14 - 54 U/L 10(L)   CBC Latest Ref Rng & Units 03/04/2017  WBC 3.6 - 11.0 K/uL 9.7  Hemoglobin 12.0 - 16.0 g/dL 12.1  Hematocrit 35 - 47 % 36.6  Platelets 150 - 440 K/uL 101(L)     Assessment and plan- Patient is a 34 y.o. female referred for iron deficiency anemia  Discussed the results of recent blood work which were consistent with iron deficiency anemia.  Most likely causes menorrhagia.  Patient is hesitant to proceed with hormonal measures to control her menorrhagia.  We discussed oral versus IV iron.  IV  iron would have faster results but unless her menorrhagia is controlled she may need IV iron at regular intervals in the future.  Discussed options include 2 doses of Feraheme 510 mg versus 5 doses of Venofer 200 mg.  Each infusion takes about 30 minutes and usually tolerated well.  Rare side effects include allergic reaction to iron infusion including all but not limited to nausea, chest pain, shortness of breath and skin rash.  Patient would like to process all this information and get back to [place].  For now she will continue taking her oral iron I will repeat her levels CBC ferritin and iron studies in 2 months   Thank you for this kind referral and the opportunity to participate in the care of this patient   Visit Diagnosis 1. Iron deficiency anemia, unspecified iron deficiency anemia type     Dr. Randa Evens, MD, MPH [facility] at [facility] [phone] 11/30/2019  1:46 PM

## 2020-01-30 ENCOUNTER — Other Ambulatory Visit: Payer: Self-pay

## 2020-01-30 ENCOUNTER — Inpatient Hospital Stay: Payer: Commercial Managed Care - PPO | Attending: Oncology

## 2020-01-30 DIAGNOSIS — D509 Iron deficiency anemia, unspecified: Secondary | ICD-10-CM | POA: Insufficient documentation

## 2020-01-30 LAB — IRON AND TIBC
Iron: 68 ug/dL (ref 28–170)
Saturation Ratios: 17 % (ref 10.4–31.8)
TIBC: 392 ug/dL (ref 250–450)
UIBC: 324 ug/dL

## 2020-01-30 LAB — CBC
HCT: 39.3 % (ref 36.0–46.0)
Hemoglobin: 12.2 g/dL (ref 12.0–15.0)
MCH: 24.4 pg — ABNORMAL LOW (ref 26.0–34.0)
MCHC: 31 g/dL (ref 30.0–36.0)
MCV: 78.8 fL — ABNORMAL LOW (ref 80.0–100.0)
Platelets: 225 10*3/uL (ref 150–400)
RBC: 4.99 MIL/uL (ref 3.87–5.11)
RDW: 21.1 % — ABNORMAL HIGH (ref 11.5–15.5)
WBC: 5 10*3/uL (ref 4.0–10.5)
nRBC: 0 % (ref 0.0–0.2)

## 2020-01-30 LAB — FERRITIN: Ferritin: 13 ng/mL (ref 11–307)

## 2020-02-05 ENCOUNTER — Inpatient Hospital Stay (HOSPITAL_BASED_OUTPATIENT_CLINIC_OR_DEPARTMENT_OTHER): Payer: Commercial Managed Care - PPO | Admitting: Oncology

## 2020-02-05 DIAGNOSIS — D509 Iron deficiency anemia, unspecified: Secondary | ICD-10-CM

## 2020-02-05 NOTE — Progress Notes (Signed)
I connected with Jessica Cantu on 02/05/20 at  9:30 AM EDT by video enabled telemedicine visit and verified that I am speaking with the correct person using two identifiers.   I discussed the limitations, risks, security and privacy concerns of performing an evaluation and management service by telemedicine and the availability of in-person appointments. I also discussed with the patient that there may be a patient responsible charge related to this service. The patient expressed understanding and agreed to proceed.  Other persons participating in the visit and their role in the encounter:  none  Patient's location:  home Provider's location:  car  Chief Complaint: Routine follow-up of iron deficiency anemia  History of present illness: Patient is a 34 year old African-American female G3, P3 L3 referred for iron deficiency anemia.  Her most recent CBC on 11/27/2019 showed white count of 6.4, H&H of 9.1/33.5 with an MCV of 68.2 and a platelet count of 243.  Ferritin levels were low at 16 and iron studies showed an elevated TIBC of 515.  B12 levels were normal.  A month prior to that her H&H was 7.8/28.4.  Patient admits to having heavy menstrual cycles after the birth of her third child and she has seen Dr. Leonides Schanz for this.  She is hesitant to start any hormonal contraceptives to regulate her menstrual cycles.  She is also contemplating if she needs to go through [facility] when she goes for her dermoid cyst procedure.  She denies any consistent use of NSAIDs.  Denies any family history of colon cancer.   Interval history: Patient reports tolerating oral iron well without any significant side effects.  She has been taking 1 pill daily.  Menstrual cycles are still heavy.   Review of Systems  Constitutional: Negative for chills, fever, malaise/fatigue and weight loss.  HENT: Negative for congestion, ear discharge and nosebleeds.   Eyes: Negative for blurred vision.  Respiratory: Negative for cough, hemoptysis,  sputum production, shortness of breath and wheezing.   Cardiovascular: Negative for chest pain, palpitations, orthopnea and claudication.  Gastrointestinal: Negative for abdominal pain, blood in stool, constipation, diarrhea, heartburn, melena, nausea and vomiting.  Genitourinary: Negative for dysuria, flank pain, frequency, hematuria and urgency.  Musculoskeletal: Negative for back pain, joint pain and myalgias.  Skin: Negative for rash.  Neurological: Negative for dizziness, tingling, focal weakness, seizures, weakness and headaches.  Endo/Heme/Allergies: Does not bruise/bleed easily.  Psychiatric/Behavioral: Negative for depression and suicidal ideas. The patient does not have insomnia.     No Known Allergies  Past Medical History:  Diagnosis Date  . 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 Fibrocystic breast changes   . Gestational thrombocytopenia ([place])   . H/O gastroesophageal reflux (GERD)   . 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 TONSILLECTOMY      Social History   Socioeconomic History  . Marital status: Married    Spouse name: Not on file  . Number of children: Not on file  . Years of education: Not on file  . Highest education level: Not on file  Occupational History  . Not on file  Tobacco Use  . Smoking status: Never Smoker  . Smokeless tobacco: Never Used  Substance and  Sexual Activity  . Alcohol use: Yes    Comment: social  . Drug use: No  . Sexual activity: Yes    Partners: Male    Birth control/protection: None  Other Topics Concern  . Not on file  Social History Narrative  . Not on file   Social Determinants of Health   Financial Resource Strain:   . Difficulty of Paying  Living Expenses: Not on file  Food Insecurity:   . Worried About Charity fundraiser in the Last Year: Not on file  . Ran Out of Food in the Last Year: Not on file  Transportation Needs:   . Lack of Transportation (Medical): Not on file  . Lack of Transportation (Non-Medical): Not on file  Physical Activity:   . Days of Exercise per Week: Not on file  . Minutes of Exercise per Session: Not on file  Stress:   . Feeling of Stress : Not on file  Social Connections:   . Frequency of Communication with Friends and Family: Not on file  . Frequency of Social Gatherings with Friends and Family: Not on file  . Attends Religious Services: Not on file  . Active Member of Clubs or Organizations: Not on file  . Attends Archivist Meetings: Not on file  . Marital Status: Not on file  Intimate Partner Violence:   . Fear of Current or Ex-Partner: Not on file  . Emotionally Abused: Not on file  . Physically Abused: Not on file  . Sexually Abused: Not on file    Family History  Problem Relation Age of Onset  . Hypertension Father   . Diabetes Maternal Grandmother   . Hypertension Maternal Grandmother   . Diabetes Maternal Grandfather   . Diabetes Paternal Grandmother   . Diabetes Paternal Grandfather   . Cancer Paternal Grandfather      Current Outpatient Medications:  .  calcium carbonate (TUMS - DOSED IN MG ELEMENTAL CALCIUM) 500 MG chewable tablet, Chew 1 tablet as needed by mouth for indigestion or heartburn., Disp: , Rfl:  .  ergocalciferol (VITAMIN D2) 1.25 MG (50000 UT) capsule, Take by mouth., Disp: , Rfl:  .  hydrocortisone (ANUSOL-HC) 2.5 % rectal cream, Place rectally., Disp: , Rfl:  .  ibuprofen (ADVIL,MOTRIN) 600 MG tablet, Take 1 tablet (600 mg total) every 6 (six) hours by mouth., Disp: 30 tablet, Rfl: 0 .  Prenatal Vit-Fe Fumarate-FA (PRENATAL MULTIVITAMIN) TABS tablet, Take 1 tablet by mouth daily at 12 noon. (Patient not taking: Reported on 02/05/2020), Disp: ,  Rfl:  .  Vitamin D, Ergocalciferol, (DRISDOL) 1.25 MG (50000 UNIT) CAPS capsule, Take 50,000 Units by mouth once a week. (Patient not taking: Reported on 02/05/2020), Disp: , Rfl:   No results found.  No images are attached to the encounter.   CMP Latest Ref Rng & Units 03/03/2017  Glucose 65 - 99 mg/dL 98  BUN 6 - 20 mg/dL <5(L)  Creatinine 0.44 - 1.00 mg/dL 0.47  Sodium 135 - 145 mmol/L 137  Potassium 3.5 - 5.1 mmol/L 3.8  Chloride 101 - 111 mmol/L 104  CO2 22 - 32 mmol/L 22  Calcium 8.9 - 10.3 mg/dL 8.6(L)  Total Protein 6.5 - 8.1 g/dL 6.8  Total Bilirubin 0.3 - 1.2 mg/dL 0.1(L)  Alkaline Phos 38 - 126 U/L 187(H)  AST 15 - 41 U/L 33  ALT 14 - 54 U/L 10(L)   CBC Latest Ref Rng & Units 01/30/2020  WBC 4.0 - 10.5 K/uL 5.0  Hemoglobin 12.0 - 15.0 g/dL 12.2  Hematocrit 36 - 46 % 39.3  Platelets 150 - 400 K/uL 225     Observation/objective: Appears in no acute distress over video visit today.  Breathing is nonlabored  Assessment and plan: Patient is a 34 year old female with history of iron deficiency anemia and this is a routine follow-up visit  Present labs show that her hemoglobin is improved to 12.2 from a prior value of 9.1.   Ferritin levels are still low at 13 but given the improvement in her hemoglobin I am inclined to watch this conservatively without the need for IV iron at this time.  Follow-up instructions: CBC ferritin and iron studies in 4 and 8 months and I will see her back in 8 months  I discussed the assessment and treatment plan with the patient. The patient was provided an opportunity to ask questions and all were answered. The patient agreed with the plan and demonstrated an understanding of the instructions.   The patient was advised to call back or seek an in-person evaluation if the symptoms worsen or if the condition fails to improve as anticipated.  Visit Diagnosis: 1. Iron deficiency anemia, unspecified iron deficiency anemia type     Dr. Randa Evens, MD, MPH [facility] at [facility] Tel- [phone] 02/05/2020 3:53 PM

## 2020-06-09 ENCOUNTER — Inpatient Hospital Stay: Payer: Commercial Managed Care - PPO | Attending: Oncology

## 2020-10-06 ENCOUNTER — Inpatient Hospital Stay: Payer: Commercial Managed Care - PPO

## 2020-10-07 ENCOUNTER — Inpatient Hospital Stay: Payer: Commercial Managed Care - PPO | Attending: Oncology

## 2020-10-07 DIAGNOSIS — N920 Excessive and frequent menstruation with regular cycle: Secondary | ICD-10-CM | POA: Insufficient documentation

## 2020-10-07 DIAGNOSIS — D509 Iron deficiency anemia, unspecified: Secondary | ICD-10-CM

## 2020-10-07 DIAGNOSIS — R5383 Other fatigue: Secondary | ICD-10-CM | POA: Insufficient documentation

## 2020-10-07 LAB — CBC WITH DIFFERENTIAL/PLATELET
Abs Immature Granulocytes: 0.02 10*3/uL (ref 0.00–0.07)
Basophils Absolute: 0 10*3/uL (ref 0.0–0.1)
Basophils Relative: 0 %
Eosinophils Absolute: 0.1 10*3/uL (ref 0.0–0.5)
Eosinophils Relative: 1 %
HCT: 41.3 % (ref 36.0–46.0)
Hemoglobin: 13.5 g/dL (ref 12.0–15.0)
Immature Granulocytes: 0 %
Lymphocytes Relative: 34 %
Lymphs Abs: 2.7 10*3/uL (ref 0.7–4.0)
MCH: 27.2 pg (ref 26.0–34.0)
MCHC: 32.7 g/dL (ref 30.0–36.0)
MCV: 83.1 fL (ref 80.0–100.0)
Monocytes Absolute: 0.8 10*3/uL (ref 0.1–1.0)
Monocytes Relative: 10 %
Neutro Abs: 4.3 10*3/uL (ref 1.7–7.7)
Neutrophils Relative %: 55 %
Platelets: 226 10*3/uL (ref 150–400)
RBC: 4.97 MIL/uL (ref 3.87–5.11)
RDW: 14.3 % (ref 11.5–15.5)
WBC: 7.9 10*3/uL (ref 4.0–10.5)
nRBC: 0 % (ref 0.0–0.2)

## 2020-10-07 LAB — IRON AND TIBC
Iron: 63 ug/dL (ref 28–170)
Saturation Ratios: 14 % (ref 10.4–31.8)
TIBC: 449 ug/dL (ref 250–450)
UIBC: 386 ug/dL

## 2020-10-07 LAB — FERRITIN: Ferritin: 29 ng/mL (ref 11–307)

## 2020-10-09 ENCOUNTER — Inpatient Hospital Stay (HOSPITAL_BASED_OUTPATIENT_CLINIC_OR_DEPARTMENT_OTHER): Payer: Commercial Managed Care - PPO | Admitting: Oncology

## 2020-10-09 ENCOUNTER — Other Ambulatory Visit: Payer: Self-pay | Admitting: *Deleted

## 2020-10-09 ENCOUNTER — Encounter: Payer: Self-pay | Admitting: Oncology

## 2020-10-09 DIAGNOSIS — D509 Iron deficiency anemia, unspecified: Secondary | ICD-10-CM

## 2020-10-09 NOTE — Progress Notes (Signed)
Pt will speak to you about the results.

## 2020-10-10 NOTE — Progress Notes (Signed)
I connected with Jessica Cantu on 10/10/20 at  2:30 PM EDT by video enabled telemedicine visit and verified that I am speaking with the correct person using two identifiers.   I discussed the limitations, risks, security and privacy concerns of performing an evaluation and management service by telemedicine and the availability of in-person appointments. I also discussed with the patient that there may be a patient responsible charge related to this service. The patient expressed understanding and agreed to proceed.  Other persons participating in the visit and their role in the encounter:  none  Patient's location:  home Provider's location:  home  Chief Complaint: Routine follow-up of iron deficiency anemia  History of present illness: Patient is a 35 year old African-American female G3, P3 L3 referred for iron deficiency anemia.  Her most recent CBC on 11/27/2019 showed white count of 6.4, H&H of 9.1/33.5 with an MCV of 68.2 and a platelet count of 243.  Ferritin levels were low at 16 and iron studies showed an elevated TIBC of 515.  B12 levels were normal.  A month prior to that her H&H was 7.8/28.4.  Patient admits to having heavy menstrual cycles after the birth of her third child and she has seen Dr. Leonides Schanz for this.  She is hesitant to start any hormonal contraceptives to regulate her menstrual cycles.  She is also contemplating if she needs to go through [facility] when she goes for her dermoid cyst procedure.  She denies any consistent use of NSAIDs.  Denies any family history of colon cancer.   Interval history: Patient reports tolerating oral iron well without any significant side effects.  She has been taking 1 pill daily.  Menstrual cycles are still heavy.    Interval history patient is doing well on oral iron which she is taking once daily.  She is not having any side effects from oral iron.  She still has some ongoing fatigue but overall feels improved as compared to before.  Menstrual cycles  continue to be heavy and she will be discussing further with GYN.   Review of Systems  Constitutional:  Positive for malaise/fatigue. Negative for chills, fever and weight loss.  HENT:  Negative for congestion, ear discharge and nosebleeds.   Eyes:  Negative for blurred vision.  Respiratory:  Negative for cough, hemoptysis, sputum production, shortness of breath and wheezing.   Cardiovascular:  Negative for chest pain, palpitations, orthopnea and claudication.  Gastrointestinal:  Negative for abdominal pain, blood in stool, constipation, diarrhea, heartburn, melena, nausea and vomiting.  Genitourinary:  Negative for dysuria, flank pain, frequency, hematuria and urgency.  Musculoskeletal:  Negative for back pain, joint pain and myalgias.  Skin:  Negative for rash.  Neurological:  Negative for dizziness, tingling, focal weakness, seizures, weakness and headaches.  Endo/Heme/Allergies:  Does not bruise/bleed easily.  Psychiatric/Behavioral:  Negative for depression and suicidal ideas. The patient does not have insomnia.    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/11/2015   Overview:  3.5x2.7 cm c/w dermoid on 4/24 4.41x2.91x3.4 on 10/31  -plan to follow up postpartum   Fibrocystic breast changes    Gestational thrombocytopenia ([place])    H/O gastroesophageal reflux (GERD)    Pap smear of cervix shows high risk HPV present 12/2014   12/2014: NILM, HPV 18+; 02/2016: NILM, HPV neg; 06/2016: NILM, HR HPV+, declined colpo in pregnancy    Past Surgical History:  Procedure Laterality Date   TONSILLECTOMY  Social History   Socioeconomic History   Marital status: Married    Spouse name: Not on file   Number of children: Not on file   Years of education: Not on file   Highest education level: Not on file  Occupational History   Not on file  Tobacco Use   Smoking status: Never   Smokeless tobacco: Never  Vaping Use   Vaping Use:  Never used  Substance and Sexual Activity   Alcohol use: Yes    Comment: social   Drug use: No   Sexual activity: Yes    Partners: Male    Birth control/protection: None  Other Topics Concern   Not on file  Social History Narrative   Not on file   Social Determinants of Health   Financial Resource Strain: Not on file  Food Insecurity: Not on file  Transportation Needs: Not on file  Physical Activity: Not on file  Stress: Not on file  Social Connections: Not on file  Intimate Partner Violence: Not on file    Family History  Problem Relation Age of Onset   Stroke Mother    Hypertension Father    Diabetes Maternal Grandmother    Hypertension Maternal Grandmother    Diabetes Maternal Grandfather    Diabetes Paternal Grandmother    Diabetes Paternal Grandfather    Cancer Paternal Grandfather      Current Outpatient Medications:    Ergocalciferol (VITAMIN D2 PO), Take 1 capsule by mouth daily., Disp: , Rfl:    ferrous sulfate 325 (65 FE) MG tablet, Take 325 mg by mouth daily with breakfast., Disp: , Rfl:    ibuprofen (ADVIL,MOTRIN) 600 MG tablet, Take 1 tablet (600 mg total) every 6 (six) hours by mouth., Disp: 30 tablet, Rfl: 0  No results found.  No images are attached to the encounter.   CMP Latest Ref Rng & Units 03/03/2017  Glucose 65 - 99 mg/dL 98  BUN 6 - 20 mg/dL <5(L)  Creatinine 0.44 - 1.00 mg/dL 0.47  Sodium 135 - 145 mmol/L 137  Potassium 3.5 - 5.1 mmol/L 3.8  Chloride 101 - 111 mmol/L 104  CO2 22 - 32 mmol/L 22  Calcium 8.9 - 10.3 mg/dL 8.6(L)  Total Protein 6.5 - 8.1 g/dL 6.8  Total Bilirubin 0.3 - 1.2 mg/dL 0.1(L)  Alkaline Phos 38 - 126 U/L 187(H)  AST 15 - 41 U/L 33  ALT 14 - 54 U/L 10(L)   CBC Latest Ref Rng & Units 10/07/2020  WBC 4.0 - 10.5 K/uL 7.9  Hemoglobin 12.0 - 15.0 g/dL 13.5  Hematocrit 36.0 - 46.0 % 41.3  Platelets 150 - 400 K/uL 226     Observation/objective: Appears in no acute distress over video visit today.  Breathing is  nonlabored  Assessment and plan: Patient is a 35 year old female with history of iron deficiency anemia possibly secondary to menorrhagia and this is a Routine follow-up visit   Patient's hemoglobin back in August 2021 was down to 9.1 with an MCV of 68 and clear evidence of iron deficiency.  After she started taking oral iron her hemoglobin is now improved to 13.5 with an MCV of 83.  Iron studies do look improved although ferritin is still borderline low at 29 with an iron saturation of 14%.  However given the improvement in her hemoglobin and iron studies as well as symptoms and plan to continue oral iron which she can take either once a day or every other day for better absorption.  She does not require IV iron at this time.  Repeat CBC ferritin and iron studies in 4 and 8 months and I will see her back in 8 months  Follow-up instructions: As above  I discussed the assessment and treatment plan with the patient. The patient was provided an opportunity to ask questions and all were answered. The patient agreed with the plan and demonstrated an understanding of the instructions.   The patient was advised to call back or seek an in-person evaluation if the symptoms worsen or if the condition fails to improve as anticipated.    Visit Diagnosis: 1. Iron deficiency anemia, unspecified iron deficiency anemia type     Dr. Randa Evens, MD, MPH [facility] at [facility] Tel- [phone] 10/10/2020 8:39 AM

## 2021-02-09 ENCOUNTER — Inpatient Hospital Stay: Payer: Commercial Managed Care - PPO | Attending: Oncology

## 2021-04-27 ENCOUNTER — Other Ambulatory Visit: Payer: Self-pay

## 2021-04-27 ENCOUNTER — Encounter: Payer: Self-pay | Admitting: Dermatology

## 2021-04-27 ENCOUNTER — Ambulatory Visit: Payer: Commercial Managed Care - PPO | Admitting: Dermatology

## 2021-04-27 DIAGNOSIS — Q825 Congenital non-neoplastic nevus: Secondary | ICD-10-CM | POA: Diagnosis not present

## 2021-04-27 DIAGNOSIS — D2272 Melanocytic nevi of left lower limb, including hip: Secondary | ICD-10-CM

## 2021-04-27 DIAGNOSIS — L918 Other hypertrophic disorders of the skin: Secondary | ICD-10-CM | POA: Diagnosis not present

## 2021-04-27 DIAGNOSIS — D489 Neoplasm of uncertain behavior, unspecified: Secondary | ICD-10-CM

## 2021-04-27 DIAGNOSIS — L810 Postinflammatory hyperpigmentation: Secondary | ICD-10-CM

## 2021-04-27 DIAGNOSIS — L821 Other seborrheic keratosis: Secondary | ICD-10-CM

## 2021-04-27 NOTE — Progress Notes (Signed)
New Patient Visit  Subjective  Jessica Cantu is a 36 y.o. female who presents for the following: Skin Tag (Face, neck, breast folds. Rubbed, irritated by clothing at times. Would like to discuss if necessary to remove. A couple of irritated lesions have fallen off since making this appointment. ). She has a birthmark on the right face and eye without change. She has spots on her feet that have been evaluated in the past.  She states they have only been there a few years and does not know if they have changed. The patient has spots, moles and lesions to be evaluated, some may be new or changing and the patient has concerns that these could be cancer.  Review of Systems: No other skin or systemic complaints except as noted in HPI or Assessment and Plan.  Objective  Well appearing patient in no apparent distress; mood and affect are within normal limits.  A focused examination was performed including face, . Relevant physical exam findings are noted in the Assessment and Plan.  face, neck, breast folds Stuck-on, waxy, tan-brown papules  -- Discussed benign etiology and prognosis.   Right temple, forehead, upper and lower eyelid, cheek Hyperpigmentation      Neck - Anterior Fleshy, skin-colored pedunculated papules.    left plantar foot Irregular brown macule. 0.8 x 0.4 cm at left ant sole of foot. Dur: 7-10 years per patient, unaware of changes.     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 - Observe - Call for any changes Reviewed this in detail.  Advised NOT consistent with  Sign of Leser-Trelat. Reassured benign age-related growth.  Recommend observation.  Discussed treatment if spot(s) become irritated or  inflamed. Treatment options discussed. Recommend ED over LN2. Higher risk of dyschromia with LN2 Tx on darker skin.   Reassured not usually associated with internal/underlying conditions.   Birth mark Right temple, forehead, upper and lower eyelid, cheek Benign-appearing.  Observation.  Call clinic for new or changing lesions.  Recommend daily use of broad spectrum spf 30+ sunscreen to sun-exposed areas.   Skin tag Neck - Anterior Benign-appearing.  Observation.  Call clinic for new or changing lesions.  Recommend daily use of broad spectrum spf 30+ sunscreen to sun-exposed areas.   Not covered by insurance. RTC for treatment PRN changes.  Neoplasm of uncertain behavior left plantar foot Epidermal/dermal shaving Type of biopsy: shave removal   Lesion diameter: 0.8cm Informed consent: discussed and consent obtained   Timeout: patient name, date of birth, surgical site, and procedure verified   Procedure prep:  Patient was prepped and draped in usual sterile fashion Prep type:  Isopropyl alcohol Anesthesia: the lesion was anesthetized in a standard fashion   Anesthetic:  1% lidocaine w/ epinephrine 1-100,000 buffered w/ 8.4% NaHCO3 Instrument used: flexible razor blade   Hemostasis achieved with: pressure, aluminum chloride and electrodesiccation   Outcome: patient tolerated procedure well   Post-procedure details: sterile dressing applied and wound care instructions given   Dressing type: bandage and petrolatum    Specimen 1 - Surgical pathology Differential Diagnosis: R/O dysplastic nevus Check Margins: No  Post-inflammatory hyperpigmentation -dark macules of the right foot sole B/L plantar feet Benign-appearing.  Observation.  Call clinic for new or changing moles.  Recommend daily  use of broad spectrum spf 30+ sunscreen to sun-exposed areas.    Return in 1 year; sooner if problem or changes.  I, Emelia Salisbury, CMA, am acting as scribe for Sarina Ser, MD. Documentation:  I have reviewed the above documentation for accuracy and completeness, and I agree with the above.  Sarina Ser, MD

## 2021-04-27 NOTE — Patient Instructions (Addendum)
Wound Care Instructions  Cleanse wound gently with soap and water once a day then pat dry with clean gauze. Apply a thing coat of Petrolatum (petroleum jelly, "Vaseline") over the wound (unless you have an allergy to this). We recommend that you use a new, sterile tube of Vaseline. Do not pick or remove scabs. Do not remove the yellow or white "healing tissue" from the base of the wound.  Cover the wound with fresh, clean, nonstick gauze and secure with paper tape. You may use Band-Aids in place of gauze and tape if the would is small enough, but would recommend trimming much of the tape off as there is often too much. Sometimes Band-Aids can irritate the skin.  You should call the office for your biopsy report after 1 week if you have not already been contacted.  If you experience any problems, such as abnormal amounts of bleeding, swelling, significant bruising, significant pain, or evidence of infection, please call the office immediately.  FOR ADULT SURGERY PATIENTS: If you need something for pain relief you may take 1 extra strength Tylenol (acetaminophen) AND 2 Ibuprofen (200mg  each) together every 4 hours as needed for pain. (do not take these if you are allergic to them or if you have a reason you should not take them.) Typically, you may only need pain medication for 1 to 3 days.   If You Need Anything After Your Visit  If you have any questions or concerns for your doctor, please call our main line at [phone] and press option 4 to reach your doctor's medical assistant. If no one answers, please leave a voicemail as directed and we will return your call as soon as possible. Messages left after 4 pm will be answered the following business day.   You may also send [place] a message via [place]. We typically respond to MyChart messages within 1-2 business days.  For prescription refills, please ask your pharmacy to contact our office. Our fax number is [phone].  If you have an urgent  issue when the clinic is closed that cannot wait until the next business day, you can page your doctor at the number below.    Please note that while we do our best to be available for urgent issues outside of office hours, we are not available 24/7.   If you have an urgent issue and are unable to reach [place], you may choose to seek medical care at your doctor's office, retail clinic, urgent care center, or emergency room.  If you have a medical emergency, please immediately call 911 or go to the emergency department.  Pager Numbers  - Dr. Nehemiah Massed: [phone]  - Dr. Laurence Ferrari: [phone]  - Dr. Nicole Kindred: [phone]  In the event of inclement weather, please call our main line at [phone] for an update on the status of any delays or closures.  Dermatology Medication Tips: Please keep the boxes that topical medications come in in order to help keep track of the instructions about where and how to use these. Pharmacies typically print the medication instructions only on the boxes and not directly on the medication tubes.   If your medication is too expensive, please contact our office at [phone] option 4 or send [place] a message through [place].   We are unable to tell what your co-pay for medications will be in advance as this is different depending on your insurance coverage. However, we may be able to find a substitute medication at lower cost or fill out  paperwork to get insurance to cover a needed medication.   If a prior authorization is required to get your medication covered by your insurance company, please allow [place] 1-2 business days to complete this process.  Drug prices often vary depending on where the prescription is filled and some pharmacies may offer cheaper prices.  The website www.goodrx.com contains coupons for medications through different pharmacies. The prices here do not account for what the cost may be with help from insurance (it may be cheaper with your  insurance), but the website can give you the price if you did not use any insurance.  - You can print the associated coupon and take it with your prescription to the pharmacy.  - You may also stop by our office during regular business hours and pick up a GoodRx coupon card.  - If you need your prescription sent electronically to a different pharmacy, notify our office through [facility] or by phone at [phone] option 4.     Si Usted Necesita Algo Despus de Su Visita  Tambin puede enviarnos un mensaje a travs de Pharmacist, community. Por lo general respondemos a los mensajes de MyChart en el transcurso de 1 a 2 das hbiles.  Para renovar recetas, por favor pida a su farmacia que se ponga en contacto con nuestra oficina. Harland Dingwall de fax es [place] [phone].  Si tiene un asunto urgente cuando la clnica est cerrada y que no puede esperar hasta el siguiente da hbil, puede llamar/localizar a su doctor(a) al nmero que aparece a continuacin.   Por favor, tenga en cuenta que aunque hacemos todo lo posible para estar disponibles para asuntos urgentes fuera del horario de [place], no estamos disponibles las 24 horas del da, los 7 das de la [place].   Si tiene un problema urgente y no puede comunicarse con nosotros, puede optar por buscar atencin mdica  en el consultorio de su doctor(a), en una clnica privada, en un centro de atencin urgente o en una sala de emergencias.  Si tiene Engineering geologist, por favor llame inmediatamente al 911 o vaya a la sala de emergencias.  Nmeros de bper  - Dr. Nehemiah Massed: [phone]  - Dra. Moye: [phone]  - Dra. Nicole Kindred: [phone]  En caso de inclemencias del [place], por favor llame a Johnsie Kindred principal al [phone] para una actualizacin sobre el [place] de cualquier retraso o cierre.  Consejos para la medicacin en dermatologa: Por favor, guarde las cajas en las que vienen los medicamentos de uso tpico para ayudarle a  seguir las instrucciones sobre dnde y cmo usarlos. Las farmacias generalmente imprimen las instrucciones del medicamento slo en las cajas y no directamente en los tubos del [place].   Si su medicamento es muy caro, por favor, pngase en contacto con Zigmund Daniel llamando al [phone] y presione la opcin 4 o envenos un mensaje a travs de Pharmacist, community.   No podemos decirle cul ser su copago por los medicamentos por adelantado ya que esto es diferente dependiendo de la cobertura de su seguro. Sin embargo, es posible que podamos encontrar un medicamento sustituto a Electrical engineer un formulario para que el seguro cubra el medicamento que se considera necesario.   Si se requiere una autorizacin previa para que su compaa de seguros [place] su medicamento, por favor permtanos de 1 a 2 das hbiles para completar este proceso.  Los precios de los medicamentos varan con frecuencia dependiendo del Environmental consultant de dnde se surte la receta y [place]  farmacias pueden ofrecer precios ms baratos.  El sitio web www.goodrx.com tiene cupones para medicamentos de Airline pilot. Los precios aqu no tienen en cuenta lo que podra costar con la ayuda del seguro (puede ser ms barato con su seguro), pero el sitio web puede darle el precio si no utiliz Research scientist (physical sciences).  - Puede imprimir el cupn correspondiente y llevarlo con su receta a la farmacia.  - Tambin puede pasar por nuestra oficina durante el horario de atencin regular y Charity fundraiser una tarjeta de cupones de GoodRx.  - Si necesita que su receta se enve electrnicamente a una farmacia diferente, informe a nuestra oficina a travs de MyChart de [place] o por telfono llamando al [phone] y presione la opcin 4.   Seborrheic Keratosis  What causes seborrheic keratoses? Seborrheic keratoses are harmless, common skin growths that first appear during adult life.  As time goes by, more growths appear.  Some people may develop a large number  of them.  Seborrheic keratoses appear on both covered and uncovered body parts.  They are not caused by sunlight.  The tendency to develop seborrheic keratoses can be inherited.  They vary in color from skin-colored to gray, brown, or even black.  They can be either smooth or have a rough, warty surface.   Seborrheic keratoses are superficial and look as if they were stuck on the skin.  Under the microscope this type of keratosis looks like layers upon layers of skin.  That is why at times the top layer may seem to fall off, but the rest of the growth remains and re-grows.    Treatment Seborrheic keratoses do not need to be treated, but can easily be removed in the office.  Seborrheic keratoses often cause symptoms when they rub on clothing or jewelry.  Lesions can be in the way of shaving.  If they become inflamed, they can cause itching, soreness, or burning.  Removal of a seborrheic keratosis can be accomplished by freezing, burning, or surgery. If any spot bleeds, scabs, or grows rapidly, please return to have it checked, as these can be an indication of a skin cancer.

## 2021-04-28 ENCOUNTER — Encounter: Payer: Self-pay | Admitting: Dermatology

## 2021-04-29 ENCOUNTER — Telehealth: Payer: Self-pay

## 2021-04-29 ENCOUNTER — Encounter: Payer: Self-pay | Admitting: Dermatology

## 2021-04-29 NOTE — Telephone Encounter (Signed)
-----   Message from Ralene Bathe, MD sent at 04/29/2021  5:54 PM EST ----- Diagnosis Skin , left plantar foot MELANOCYTIC NEVUS, JUNCTIONAL LENTIGINOUS TYPE, CLOSE TO MARGIN  Benign mole No further treatment needed

## 2021-04-29 NOTE — Telephone Encounter (Signed)
Advised patient of results/hd  

## 2021-06-03 ENCOUNTER — Other Ambulatory Visit: Payer: Self-pay | Admitting: *Deleted

## 2021-06-03 DIAGNOSIS — D509 Iron deficiency anemia, unspecified: Secondary | ICD-10-CM

## 2021-06-12 ENCOUNTER — Ambulatory Visit: Payer: Commercial Managed Care - PPO | Admitting: Oncology

## 2021-06-12 ENCOUNTER — Other Ambulatory Visit: Payer: Commercial Managed Care - PPO

## 2021-06-15 ENCOUNTER — Inpatient Hospital Stay: Payer: Commercial Managed Care - PPO | Attending: Nurse Practitioner

## 2021-06-15 ENCOUNTER — Other Ambulatory Visit: Payer: Self-pay

## 2021-06-15 DIAGNOSIS — D500 Iron deficiency anemia secondary to blood loss (chronic): Secondary | ICD-10-CM | POA: Diagnosis not present

## 2021-06-15 DIAGNOSIS — D509 Iron deficiency anemia, unspecified: Secondary | ICD-10-CM

## 2021-06-15 LAB — CBC WITH DIFFERENTIAL/PLATELET
Abs Immature Granulocytes: 0.02 10*3/uL (ref 0.00–0.07)
Basophils Absolute: 0 10*3/uL (ref 0.0–0.1)
Basophils Relative: 1 %
Eosinophils Absolute: 0.1 10*3/uL (ref 0.0–0.5)
Eosinophils Relative: 2 %
HCT: 38.9 % (ref 36.0–46.0)
Hemoglobin: 12.3 g/dL (ref 12.0–15.0)
Immature Granulocytes: 0 %
Lymphocytes Relative: 36 %
Lymphs Abs: 2.2 10*3/uL (ref 0.7–4.0)
MCH: 25.8 pg — ABNORMAL LOW (ref 26.0–34.0)
MCHC: 31.6 g/dL (ref 30.0–36.0)
MCV: 81.7 fL (ref 80.0–100.0)
Monocytes Absolute: 0.6 10*3/uL (ref 0.1–1.0)
Monocytes Relative: 9 %
Neutro Abs: 3.2 10*3/uL (ref 1.7–7.7)
Neutrophils Relative %: 52 %
Platelets: 238 10*3/uL (ref 150–400)
RBC: 4.76 MIL/uL (ref 3.87–5.11)
RDW: 15.3 % (ref 11.5–15.5)
WBC: 6.2 10*3/uL (ref 4.0–10.5)
nRBC: 0 % (ref 0.0–0.2)

## 2021-06-15 LAB — IRON AND TIBC
Iron: 61 ug/dL (ref 28–170)
Saturation Ratios: 14 % (ref 10.4–31.8)
TIBC: 434 ug/dL (ref 250–450)
UIBC: 373 ug/dL

## 2021-06-15 LAB — FERRITIN: Ferritin: 12 ng/mL (ref 11–307)

## 2021-06-16 ENCOUNTER — Inpatient Hospital Stay (HOSPITAL_BASED_OUTPATIENT_CLINIC_OR_DEPARTMENT_OTHER): Payer: Commercial Managed Care - PPO | Admitting: Nurse Practitioner

## 2021-06-16 ENCOUNTER — Other Ambulatory Visit: Payer: Commercial Managed Care - PPO

## 2021-06-16 ENCOUNTER — Encounter: Payer: Self-pay | Admitting: Nurse Practitioner

## 2021-06-16 DIAGNOSIS — D500 Iron deficiency anemia secondary to blood loss (chronic): Secondary | ICD-10-CM

## 2021-06-16 NOTE — Progress Notes (Signed)
Patient called/ pre- screened for virtual appoinment today with NP. No concerns expressed today

## 2021-06-16 NOTE — Progress Notes (Signed)
Hematology Progress Note [facility]  I connected with Jessica Cantu on 06/16/21 at  9:30 AM EST by video enabled telemedicine visit and verified that I am speaking with the correct person using two identifiers.   I discussed the limitations, risks, security and privacy concerns of performing an evaluation and management service by telemedicine and the availability of in-person appointments. I also discussed with the patient that there may be a patient responsible charge related to this service. The patient expressed understanding and agreed to proceed.  Other persons participating in the visit and their role in the encounter:  none  Patient's location:  home Provider's location:  clinic  Chief Complaint: Routine follow-up of iron deficiency anemia  History of present illness: Patient is a 36 year old African-American female G3, P3 L3 referred for iron deficiency anemia.  Her most recent CBC on 11/27/2019 showed white count of 6.4, H&H of 9.1/33.5 with an MCV of 68.2 and a platelet count of 243.  Ferritin levels were low at 16 and iron studies showed an elevated TIBC of 515.  B12 levels were normal.  A month prior to that her H&H was 7.8/28.4.  Patient admits to having heavy menstrual cycles after the birth of her third child and she has seen Dr. Leonides Cantu for this.  She is hesitant to start any hormonal contraceptives to regulate her menstrual cycles.  She is also contemplating if she needs to go through [facility] when she goes for her dermoid cyst procedure.  She denies any consistent use of NSAIDs.  Denies any family history of colon cancer.     Interval history: Patient is a 36 year old female who agrees to telemedicine evaluation for follow-up for iron deficiency anemia.  She continues to have heavy menstrual periods.  Has been taking oral iron, Hemoplex, every other day.  Tolerating well.  She has some ongoing fatigue. Denies any neurologic complaints. Denies recent fevers or illnesses.  Denies any easy bleeding or bruising. No melena or hematochezia. No pica or restless leg. Reports good appetite and denies weight loss. Denies chest pain. Denies any nausea, vomiting, constipation, or diarrhea. Denies urinary complaints. Patient offers no further specific complaints today.  She continues to prefer to avoid IV iron if possible.   Review of Systems  Constitutional:  Positive for malaise/fatigue. Negative for chills, fever and weight loss.  HENT:  Negative for congestion, ear discharge and nosebleeds.   Eyes:  Negative for blurred vision.  Respiratory:  Negative for cough, hemoptysis, sputum production, shortness of breath and wheezing.   Cardiovascular:  Negative for chest pain, palpitations, orthopnea and claudication.  Gastrointestinal:  Negative for abdominal pain, blood in stool, constipation, diarrhea, heartburn, melena, nausea and vomiting.  Genitourinary:  Negative for dysuria, flank pain, frequency, hematuria and urgency.  Musculoskeletal:  Negative for back pain, joint pain and myalgias.  Skin:  Negative for rash.  Neurological:  Negative for dizziness, tingling, focal weakness, seizures, weakness and headaches.  Endo/Heme/Allergies:  Does not bruise/bleed easily.  Psychiatric/Behavioral:  Negative for depression and suicidal ideas. The patient does not have insomnia.    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  12/2014: NILM, HPV 18+; 02/2016: NILM, HPV neg; 06/2016: NILM, HR HPV+, declined colpo in pregnancy    Past Surgical History:  Procedure Laterality Date   TONSILLECTOMY      Social History   Socioeconomic History   Marital status:  Married    Spouse name: Not on file   Number of children: Not on file   Years of education: Not on file   Highest education level: Not on file  Occupational History   Not on file  Tobacco Use   Smoking status: Never   Smokeless tobacco: Never  Vaping Use   Vaping Use: Never used  Substance and Sexual Activity   Alcohol use: Yes    Comment: social   Drug use: No   Sexual activity: Yes    Partners: Male    Birth control/protection: None  Other Topics Concern   Not on file  Social History Narrative   Not on file   Social Determinants of Health   Financial Resource Strain: Not on file  Food Insecurity: Not on file  Transportation Needs: Not on file  Physical Activity: Not on file  Stress: Not on file  Social Connections: Not on file  Intimate Partner Violence: Not on file    Family History  Problem Relation Age of Onset   Stroke Mother    Hypertension Father    Diabetes Maternal Grandmother    Hypertension Maternal Grandmother    Diabetes Maternal Grandfather    Diabetes Paternal Grandmother    Diabetes Paternal Grandfather    Cancer Paternal Grandfather      Current Outpatient Medications:    Ergocalciferol (VITAMIN D2 PO), Take 1 capsule by mouth daily., Disp: , Rfl:    ferrous sulfate 325 (65 FE) MG tablet, Take 325 mg by mouth daily with breakfast., Disp: , Rfl:    ibuprofen (ADVIL,MOTRIN) 600 MG tablet, Take 1 tablet (600 mg total) every 6 (six) hours by mouth., Disp: 30 tablet, Rfl: 0  No results found.  No images are attached to the encounter.  Observation/objective: Appears in no acute distress over video visit today.  Breathing is nonlabored  CMP Latest Ref Rng & Units 03/03/2017  Glucose 65 - 99 mg/dL 98  BUN 6 - 20 mg/dL <5(L)  Creatinine 0.44 - 1.00 mg/dL 0.47  Sodium 135 - 145 mmol/L 137  Potassium 3.5 - 5.1 mmol/L 3.8  Chloride 101 - 111 mmol/L 104  CO2 22 - 32 mmol/L 22  Calcium 8.9 - 10.3 mg/dL 8.6(L)  Total Protein 6.5 - 8.1 g/dL 6.8   Total Bilirubin 0.3 - 1.2 mg/dL 0.1(L)  Alkaline Phos 38 - 126 U/L 187(H)  AST 15 - 41 U/L 33  ALT 14 - 54 U/L 10(L)   CBC Latest Ref Rng & Units 06/15/2021  WBC 4.0 - 10.5 K/uL 6.2  Hemoglobin 12.0 - 15.0 g/dL 12.3  Hematocrit 36.0 - 46.0 % 38.9  Platelets 150 - 400 K/uL 238   Iron/TIBC/Ferritin/ %Sat    Component Value Date/Time   IRON 61 06/15/2021 0828   TIBC 434 06/15/2021 0828   FERRITIN 12 06/15/2021 0828   IRONPCTSAT 14 06/15/2021 0828     Assessment and plan: Patient is a 36 year old female who presents for follow-up  1.  Iron deficiency anemia-possibly secondary to menorrhagia.  Declined IV iron due to preference.  Currently taking oral iron every other day.  She is taking Hemoplex which contains 45 mg of ferrous fumarate with vitamin C.  Tolerating well.   Hemoglobin  remains normal at 12.3.  Mild microcytosis.  Ferritin has decreased to 12 with iron saturation 14%.  TIBC is upper limit of normal.  Reviewed options for management given that she is symptomatic including IV iron versus continuing oral iron.  Her preference is to continue oral iron. We again reviewed that providing iron supplements daily as divided doses increases serum hepcidin and reduces iron absorption.  Recommend taking iron supplements on alternate days or in single doses to optimize iron absorption.  - Stoffel NU, Cercamondi CI, Brittenham G, Zeder C, Geurts-Moespot AJ, Swinkels DW, Cayce D, Pronghorn MB. Iron absorption from oral iron supplements given on consecutive versus alternate days and as single morning doses versus twice-daily split dosing in iron-depleted women: two open-label, randomised controlled trials. Lancet Haematol. 2017 Nov;4(11):e524-e533. doi: 10.1016/S2352-3026(17)30182-5. Epub 2017 Oct 9. PMID: 00370488. She will return to daily dosing and will try 2 pills as opposed to one. Reviewed that she may develop GI side effects with increased iron and if so, reduce to 1 tablet.   2.  Menorrhagia- managed by [facility]. Declined IUD.   Follow-up instructions:  - 3 mo- lab (cbc, ferritin, iron studies). Day to week later virtual visit with Dr. Janese Banks- la  I discussed the assessment and treatment plan with the patient. The patient was provided an opportunity to ask questions and all were answered. The patient agreed with the plan and demonstrated an understanding of the instructions.   The patient was advised to call back or seek an in-person evaluation if the symptoms worsen or if the condition fails to improve as anticipated.  Visit Diagnosis: 1. Iron deficiency anemia due to chronic blood loss    Beckey Rutter, DNP, AGNP-C [place] [facility] [place] [phone] (clinic) 06/16/2021

## 2021-09-09 ENCOUNTER — Inpatient Hospital Stay: Payer: Commercial Managed Care - PPO | Attending: Oncology

## 2021-09-09 DIAGNOSIS — D509 Iron deficiency anemia, unspecified: Secondary | ICD-10-CM | POA: Diagnosis not present

## 2021-09-09 DIAGNOSIS — Z86018 Personal history of other benign neoplasm: Secondary | ICD-10-CM | POA: Insufficient documentation

## 2021-09-09 DIAGNOSIS — K219 Gastro-esophageal reflux disease without esophagitis: Secondary | ICD-10-CM | POA: Insufficient documentation

## 2021-09-09 LAB — CBC WITH DIFFERENTIAL/PLATELET
Abs Immature Granulocytes: 0.01 10*3/uL (ref 0.00–0.07)
Basophils Absolute: 0 10*3/uL (ref 0.0–0.1)
Basophils Relative: 1 %
Eosinophils Absolute: 0.2 10*3/uL (ref 0.0–0.5)
Eosinophils Relative: 2 %
HCT: 41 % (ref 36.0–46.0)
Hemoglobin: 12.9 g/dL (ref 12.0–15.0)
Immature Granulocytes: 0 %
Lymphocytes Relative: 39 %
Lymphs Abs: 2.4 10*3/uL (ref 0.7–4.0)
MCH: 25.7 pg — ABNORMAL LOW (ref 26.0–34.0)
MCHC: 31.5 g/dL (ref 30.0–36.0)
MCV: 81.8 fL (ref 80.0–100.0)
Monocytes Absolute: 0.5 10*3/uL (ref 0.1–1.0)
Monocytes Relative: 8 %
Neutro Abs: 3.2 10*3/uL (ref 1.7–7.7)
Neutrophils Relative %: 50 %
Platelets: 217 10*3/uL (ref 150–400)
RBC: 5.01 MIL/uL (ref 3.87–5.11)
RDW: 14.6 % (ref 11.5–15.5)
WBC: 6.3 10*3/uL (ref 4.0–10.5)
nRBC: 0 % (ref 0.0–0.2)

## 2021-09-09 LAB — FERRITIN: Ferritin: 16 ng/mL (ref 11–307)

## 2021-09-09 LAB — IRON AND TIBC
Iron: 70 ug/dL (ref 28–170)
Saturation Ratios: 18 % (ref 10.4–31.8)
TIBC: 381 ug/dL (ref 250–450)
UIBC: 311 ug/dL

## 2021-09-10 ENCOUNTER — Encounter: Payer: Self-pay | Admitting: Medical Oncology

## 2021-09-10 ENCOUNTER — Inpatient Hospital Stay (HOSPITAL_BASED_OUTPATIENT_CLINIC_OR_DEPARTMENT_OTHER): Payer: Commercial Managed Care - PPO | Admitting: Medical Oncology

## 2021-09-10 DIAGNOSIS — D500 Iron deficiency anemia secondary to blood loss (chronic): Secondary | ICD-10-CM

## 2021-09-10 DIAGNOSIS — D509 Iron deficiency anemia, unspecified: Secondary | ICD-10-CM | POA: Diagnosis not present

## 2021-09-10 NOTE — Progress Notes (Signed)
Hematology Progress Note [facility]  I connected with Jessica Cantu on 09/10/21 at 11:00 AM EDT by video enabled telemedicine visit and verified that I am speaking with the correct person using two identifiers.   I discussed the limitations, risks, security and privacy concerns of performing an evaluation and management service by telemedicine and the availability of in-person appointments. I also discussed with the patient that there may be a patient responsible charge related to this service. The patient expressed understanding and agreed to proceed.  Other persons participating in the visit and their role in the encounter:  none  Patient's location:  home Provider's location:  clinic  Chief Complaint: Routine follow-up of iron deficiency anemia  History of present illness: Patient is a 36 year old African-American female G3, P3 L3 referred for iron deficiency anemia.  Her most recent CBC on 11/27/2019 showed white count of 6.4, H&H of 9.1/33.5 with an MCV of 68.2 and a platelet count of 243.  Ferritin levels were low at 16 and iron studies showed an elevated TIBC of 515.  B12 levels were normal.  A month prior to that her H&H was 7.8/28.4.  Patient admits to having heavy menstrual cycles after the birth of her third child and she has seen Dr. Leonides Schanz for this.  She is hesitant to start any hormonal contraceptives to regulate her menstrual cycles.  She is also contemplating if she needs to go through [facility] when she goes for her dermoid cyst procedure.  She denies any consistent use of NSAIDs.  Denies any family history of colon cancer.     Interval history: Patient is a 36 year old female who agrees to telemedicine evaluation for follow-up for iron deficiency anemia.  She reports that her periods are unchanged. First 1-1.5 days are very heavy using about 4 super tampons per day. Cycle then lightens and lasts a total of 7 days with 2 of those days being spotting. She continues her daily  Hemoplex which she takes daily in the morning. She has discussed with OB-GYN. Denies any neurologic complaints. Denies recent fevers or illnesses. Denies any easy bleeding or bruising. No melena or hematochezia. No pica or restless leg. Reports good appetite and denies weight loss. Denies chest pain. Denies any nausea, vomiting, constipation, or diarrhea. Denies urinary complaints. Patient offers no further specific complaints today.  She continues to prefer to avoid IV iron if possible.   Review of Systems  Constitutional:  Positive for malaise/fatigue. Negative for chills, fever and weight loss.  HENT:  Negative for congestion, ear discharge and nosebleeds.   Eyes:  Negative for blurred vision.  Respiratory:  Negative for cough, hemoptysis, sputum production, shortness of breath and wheezing.   Cardiovascular:  Negative for chest pain, palpitations, orthopnea and claudication.  Gastrointestinal:  Negative for abdominal pain, blood in stool, constipation, diarrhea, heartburn, melena, nausea and vomiting.  Genitourinary:  Negative for dysuria, flank pain, frequency, hematuria and urgency.  Musculoskeletal:  Negative for back pain, joint pain and myalgias.  Skin:  Negative for rash.  Neurological:  Negative for dizziness, tingling, focal weakness, seizures, weakness and headaches.  Endo/Heme/Allergies:  Does not bruise/bleed easily.  Psychiatric/Behavioral:  Negative for depression and suicidal ideas. The patient does not have insomnia.    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/11/2015   Overview:  3.5x2.7 cm c/w dermoid on 4/24 4.41x2.91x3.4 on 10/31  -plan to follow up postpartum   Fibrocystic breast changes  Gestational thrombocytopenia (HCC)    H/O gastroesophageal reflux (GERD)    Pap smear of cervix shows high risk HPV present 12/2014   12/2014: NILM, HPV 18+; 02/2016: NILM, HPV neg; 06/2016: NILM, HR HPV+, declined colpo in pregnancy     Past Surgical History:  Procedure Laterality Date   TONSILLECTOMY      Social History   Socioeconomic History   Marital status: Married    Spouse name: Not on file   Number of children: Not on file   Years of education: Not on file   Highest education level: Not on file  Occupational History   Not on file  Tobacco Use   Smoking status: Never   Smokeless tobacco: Never  Vaping Use   Vaping Use: Never used  Substance and Sexual Activity   Alcohol use: Yes    Comment: social   Drug use: No   Sexual activity: Yes    Partners: Male    Birth control/protection: None  Other Topics Concern   Not on file  Social History Narrative   Not on file   Social Determinants of Health   Financial Resource Strain: Not on file  Food Insecurity: Not on file  Transportation Needs: Not on file  Physical Activity: Not on file  Stress: Not on file  Social Connections: Not on file  Intimate Partner Violence: Not on file    Family History  Problem Relation Age of Onset   Stroke Mother    Hypertension Father    Diabetes Maternal Grandmother    Hypertension Maternal Grandmother    Diabetes Maternal Grandfather    Diabetes Paternal Grandmother    Diabetes Paternal Grandfather    Cancer Paternal Grandfather      Current Outpatient Medications:    Ergocalciferol (VITAMIN D2 PO), Take 1 capsule by mouth daily., Disp: , Rfl:    FERROUS FUMARATE-VITAMIN C ER PO, Take 2 tablets by mouth daily. Nu-Life Hemplex Iron Formulation, Disp: , Rfl:    ibuprofen (ADVIL,MOTRIN) 600 MG tablet, Take 1 tablet (600 mg total) every 6 (six) hours by mouth., Disp: 30 tablet, Rfl: 0  No results found.  No images are attached to the encounter.  Observation/objective: Appears in no acute distress over video visit today.  Breathing is nonlabored     Latest Ref Rng & Units 03/03/2017   12:08 AM  CMP  Glucose 65 - 99 mg/dL 98    BUN 6 - 20 mg/dL <5    Creatinine 0.44 - 1.00 mg/dL 0.47    Sodium  135 - 145 mmol/L 137    Potassium 3.5 - 5.1 mmol/L 3.8    Chloride 101 - 111 mmol/L 104    CO2 22 - 32 mmol/L 22    Calcium 8.9 - 10.3 mg/dL 8.6    Total Protein 6.5 - 8.1 g/dL 6.8    Total Bilirubin 0.3 - 1.2 mg/dL 0.1    Alkaline Phos 38 - 126 U/L 187    AST 15 - 41 U/L 33    ALT 14 - 54 U/L 10        Latest Ref Rng & Units 09/09/2021    2:51 PM  CBC  WBC 4.0 - 10.5 K/uL 6.3    Hemoglobin 12.0 - 15.0 g/dL 12.9    Hematocrit 36.0 - 46.0 % 41.0    Platelets 150 - 400 K/uL 217     Iron/TIBC/Ferritin/ %Sat    Component Value Date/Time   IRON 70 09/09/2021 1451   TIBC  381 09/09/2021 1451   FERRITIN 16 09/09/2021 1451   IRONPCTSAT 18 09/09/2021 1451     Assessment and plan: Patient is a 36 year old female who presents for follow-up  1.  Iron deficiency anemia-possibly secondary to menorrhagia.  Again declines IV iron due to preference.  Currently taking oral iron every day.  She is taking Hemoplex which contains 45 mg of ferrous fumarate with vitamin C.  Tolerating well.   Hemoglobin remains normal at 12.9.  Resolved microcytosis.  Ferritin has increased to 16 with iron saturation 18%.  TIBC is normal.  Reviewed options for management given that she is symptomatic including IV iron versus continuing oral iron.  Her preference is to continue oral iron. We discussed the copper IUD given her apprehension towards hormonal medications. She will continue follow up with OB-GYN. We also discussed disease monitoring. She would like to greatly increase her surveillance window which I feel is fair given her trends over the past year and stable hemoglobin levels. Her PCP is set to check iron studies and CBC in 5 months. We will plan for a 1 year follow up or sooner as needed.   Follow-up instructions:  12 mo- lab (cbc, ferritin, iron studies). Day to week later virtual visit with Dr. Janese Banks- Fleming  I discussed the assessment and treatment plan with the patient. The patient was provided an opportunity  to ask questions and all were answered. The patient agreed with the plan and demonstrated an understanding of the instructions.   The patient was advised to call back or seek an in-person evaluation if the symptoms worsen or if the condition fails to improve as anticipated.  Visit Diagnosis: 1. Iron deficiency anemia due to chronic blood loss    [facility] at [facility] [phone] (clinic) 09/10/2021

## 2021-09-10 NOTE — Progress Notes (Signed)
Patient has no concerns at the moment. 

## 2021-10-28 ENCOUNTER — Ambulatory Visit: Payer: Managed Care, Other (non HMO) | Admitting: Physician Assistant

## 2022-09-02 ENCOUNTER — Inpatient Hospital Stay: Payer: Commercial Managed Care - PPO

## 2022-09-08 ENCOUNTER — Telehealth: Payer: Commercial Managed Care - PPO | Admitting: Oncology

## 2022-10-19 ENCOUNTER — Inpatient Hospital Stay: Payer: Commercial Managed Care - PPO | Attending: Oncology

## 2022-10-19 DIAGNOSIS — D509 Iron deficiency anemia, unspecified: Secondary | ICD-10-CM | POA: Diagnosis present

## 2022-10-19 DIAGNOSIS — Z86018 Personal history of other benign neoplasm: Secondary | ICD-10-CM | POA: Diagnosis not present

## 2022-10-19 DIAGNOSIS — K219 Gastro-esophageal reflux disease without esophagitis: Secondary | ICD-10-CM | POA: Insufficient documentation

## 2022-10-19 DIAGNOSIS — D500 Iron deficiency anemia secondary to blood loss (chronic): Secondary | ICD-10-CM

## 2022-10-19 DIAGNOSIS — R5383 Other fatigue: Secondary | ICD-10-CM | POA: Diagnosis not present

## 2022-10-19 LAB — CBC WITH DIFFERENTIAL (CANCER CENTER ONLY)
Abs Immature Granulocytes: 0.03 10*3/uL (ref 0.00–0.07)
Basophils Absolute: 0 10*3/uL (ref 0.0–0.1)
Basophils Relative: 1 %
Eosinophils Absolute: 0.2 10*3/uL (ref 0.0–0.5)
Eosinophils Relative: 3 %
HCT: 37.6 % (ref 36.0–46.0)
Hemoglobin: 12.1 g/dL (ref 12.0–15.0)
Immature Granulocytes: 1 %
Lymphocytes Relative: 37 %
Lymphs Abs: 2.2 10*3/uL (ref 0.7–4.0)
MCH: 26 pg (ref 26.0–34.0)
MCHC: 32.2 g/dL (ref 30.0–36.0)
MCV: 80.9 fL (ref 80.0–100.0)
Monocytes Absolute: 0.5 10*3/uL (ref 0.1–1.0)
Monocytes Relative: 8 %
Neutro Abs: 3.1 10*3/uL (ref 1.7–7.7)
Neutrophils Relative %: 50 %
Platelet Count: 229 10*3/uL (ref 150–400)
RBC: 4.65 MIL/uL (ref 3.87–5.11)
RDW: 14.6 % (ref 11.5–15.5)
WBC Count: 6 10*3/uL (ref 4.0–10.5)
nRBC: 0 % (ref 0.0–0.2)

## 2022-10-19 LAB — IRON AND TIBC
Iron: 48 ug/dL (ref 28–170)
Saturation Ratios: 11 % (ref 10.4–31.8)
TIBC: 428 ug/dL (ref 250–450)
UIBC: 380 ug/dL

## 2022-10-19 LAB — FERRITIN: Ferritin: 15 ng/mL (ref 11–307)

## 2022-10-26 ENCOUNTER — Inpatient Hospital Stay (HOSPITAL_BASED_OUTPATIENT_CLINIC_OR_DEPARTMENT_OTHER): Payer: Commercial Managed Care - PPO | Admitting: Oncology

## 2022-10-26 ENCOUNTER — Encounter: Payer: Self-pay | Admitting: Oncology

## 2022-10-26 DIAGNOSIS — Z8639 Personal history of other endocrine, nutritional and metabolic disease: Secondary | ICD-10-CM | POA: Diagnosis not present

## 2022-10-26 NOTE — Progress Notes (Signed)
I connected with Jessica Cantu on 10/26/22 at  3:15 PM EDT by video enabled telemedicine visit and verified that I am speaking with the correct person using two identifiers.   I discussed the limitations, risks, security and privacy concerns of performing an evaluation and management service by telemedicine and the availability of in-person appointments. I also discussed with the patient that there may be a patient responsible charge related to this service. The patient expressed understanding and agreed to proceed.  Other persons participating in the visit and their role in the encounter:  none  Patient's location:  home Provider's location:  work  Stage manager Complaint: Routine follow-up history of iron deficiency without overt anemia  History of present illness: Patient is a 37 year old African-American female G3, P3 L3 referred for iron deficiency anemia.  Her most recent CBC on 11/27/2019 showed white count of 6.4, H&H of 9.1/33.5 with an MCV of 68.2 and a platelet count of 243.  Ferritin levels were low at 16 and iron studies showed an elevated TIBC of 515.  B12 levels were normal.  A month prior to that her H&H was 7.8/28.4.  Patient admits to having heavy menstrual cycles after the birth of her third child and she has seen Dr. Elesa Massed for this.  She is hesitant to start any hormonal contraceptives to regulate her menstrual cycles.  She is also contemplating if she needs to go through [facility] when she goes for her dermoid cyst procedure.  She denies any consistent use of NSAIDs.  Denies any family history of colon cancer.   Interval history: Patient reports tolerating oral iron well without any significant side effects.  She has been taking 1 pill daily.  Menstrual cycles are still heavy.    Interval history patient reports ongoing fatigue.  She is tolerating oral iron well without any significant side effects.   Review of Systems  Constitutional:  Positive for malaise/fatigue. Negative for chills, fever  and weight loss.  HENT:  Negative for congestion, ear discharge and nosebleeds.   Eyes:  Negative for blurred vision.  Respiratory:  Negative for cough, hemoptysis, sputum production, shortness of breath and wheezing.   Cardiovascular:  Negative for chest pain, palpitations, orthopnea and claudication.  Gastrointestinal:  Negative for abdominal pain, blood in stool, constipation, diarrhea, heartburn, melena, nausea and vomiting.  Genitourinary:  Negative for dysuria, flank pain, frequency, hematuria and urgency.  Musculoskeletal:  Negative for back pain, joint pain and myalgias.  Skin:  Negative for rash.  Neurological:  Negative for dizziness, tingling, focal weakness, seizures, weakness and headaches.  Endo/Heme/Allergies:  Does not bruise/bleed easily.  Psychiatric/Behavioral:  Negative for depression and suicidal ideas. The patient does not have insomnia.     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   12/2014: NILM, HPV 18+; 02/2016: NILM, HPV neg; 06/2016: NILM, HR HPV+, declined colpo in pregnancy    Past Surgical History:  Procedure Laterality Date   TONSILLECTOMY      Social History   Socioeconomic History   Marital status: Married    Spouse name: Not on file   Number of children: Not on file   Years of  education: Not on file   Highest education level: Not on file  Occupational History   Not on file  Tobacco Use   Smoking status: Never   Smokeless tobacco: Never  Vaping Use   Vaping Use: Never used  Substance and Sexual Activity   Alcohol use: Yes    Comment: social   Drug use: No   Sexual activity: Yes    Partners: Male    Birth  control/protection: None  Other Topics Concern   Not on file  Social History Narrative   Not on file   Social Determinants of Health   Financial Resource Strain: Not on file  Food Insecurity: Not on file  Transportation Needs: Not on file  Physical Activity: Not on file  Stress: Not on file  Social Connections: Not on file  Intimate Partner Violence: Not on file    Family History  Problem Relation Age of Onset   Stroke Mother    Hypertension Father    Diabetes Maternal Grandmother    Hypertension Maternal Grandmother    Diabetes Maternal Grandfather    Diabetes Paternal Grandmother    Diabetes Paternal Grandfather    Cancer Paternal Grandfather      Current Outpatient Medications:    Ergocalciferol (VITAMIN D2 PO), Take 1 capsule by mouth daily. (Patient not taking: Reported on 10/26/2022), Disp: , Rfl:    FERROUS FUMARATE-VITAMIN C ER PO, Take 2 tablets by mouth daily. Nu-Life Hemplex Iron Formulation, Disp: , Rfl:    ferrous sulfate 325 (65 FE) MG tablet, Take 325 mg by mouth. (Patient not taking: Reported on 10/26/2022), Disp: , Rfl:    ibuprofen (ADVIL,MOTRIN) 600 MG tablet, Take 1 tablet (600 mg total) every 6 (six) hours by mouth., Disp: 30 tablet, Rfl: 0  No results found.  No images are attached to the encounter.      Latest Ref Rng & Units 03/03/2017   12:08 AM  CMP  Glucose 65 - 99 mg/dL 98   BUN 6 - 20 mg/dL <5   Creatinine 1.61 - 1.00 mg/dL 0.96   Sodium 045 - 409 mmol/L 137   Potassium 3.5 - 5.1 mmol/L 3.8   Chloride 101 - 111 mmol/L 104   CO2 22 - 32 mmol/L 22   Calcium 8.9 - 10.3 mg/dL 8.6   Total Protein 6.5 - 8.1 g/dL 6.8   Total Bilirubin 0.3 - 1.2 mg/dL 0.1   Alkaline Phos 38 - 126 U/L 187   AST 15 - 41 U/L 33   ALT 14 - 54 U/L 10       Latest Ref Rng & Units 10/19/2022    8:29 AM  CBC  WBC 4.0 - 10.5 K/uL 6.0   Hemoglobin 12.0 - 15.0 g/dL 81.1   Hematocrit 91.4 - 46.0 % 37.6   Platelets 150 - 400 K/uL 229      Observation/objective:  Appears in no acute distress over video visit today.  Breathing is nonlabored  Assessment and plan: Patient is a 37 year old female with history of iron deficiency without anemia here for routine follow-up  Patient has not required any IV iron so far.  She has been on oral iron once a day and her hemoglobin has remained stable between 12-13 for the last 2 years.  Iron studies are still indicated of iron deficiency given that her ferritin levels are less than 30.  She reports fatigue fatigue.  We discussed oral versus IV iron and patient would like to hold off on taking  any IV iron at this time.  She will continue to follow-up with her primary care doctor.  She can be referred to [place] in the future if questions or concerns arise Follow-up instructions:  I discussed the assessment and treatment plan with the patient. The patient was provided an opportunity to ask questions and all were answered. The patient agreed with the plan and demonstrated an understanding of the instructions.   The patient was advised to call back or seek an in-person evaluation if the symptoms worsen or if the condition fails to improve as anticipated.  I provided 11 minutes of face-to-face video visit time during this encounter, and > 50% was spent counseling as documented under my assessment & plan.  Visit Diagnosis: 1. History of iron deficiency     Dr. Owens Shark, MD, MPH [facility] at [facility] Tel- [phone] 10/26/2022 4:14 PM

## 2023-11-09 ENCOUNTER — Inpatient Hospital Stay: Admitting: Nurse Practitioner

## 2023-11-24 ENCOUNTER — Inpatient Hospital Stay: Attending: Nurse Practitioner | Admitting: Nurse Practitioner

## 2023-11-24 ENCOUNTER — Inpatient Hospital Stay

## 2023-11-24 ENCOUNTER — Encounter: Payer: Self-pay | Admitting: Nurse Practitioner

## 2023-11-24 VITALS — BP 119/86 | HR 75 | Temp 97.0°F | Resp 17 | Wt 171.0 lb

## 2023-11-24 DIAGNOSIS — N920 Excessive and frequent menstruation with regular cycle: Secondary | ICD-10-CM | POA: Insufficient documentation

## 2023-11-24 DIAGNOSIS — D509 Iron deficiency anemia, unspecified: Secondary | ICD-10-CM | POA: Insufficient documentation

## 2023-11-24 DIAGNOSIS — Z86018 Personal history of other benign neoplasm: Secondary | ICD-10-CM | POA: Diagnosis not present

## 2023-11-24 DIAGNOSIS — Z79899 Other long term (current) drug therapy: Secondary | ICD-10-CM | POA: Insufficient documentation

## 2023-11-24 DIAGNOSIS — R5383 Other fatigue: Secondary | ICD-10-CM | POA: Insufficient documentation

## 2023-11-24 DIAGNOSIS — D500 Iron deficiency anemia secondary to blood loss (chronic): Secondary | ICD-10-CM

## 2023-11-24 DIAGNOSIS — Z809 Family history of malignant neoplasm, unspecified: Secondary | ICD-10-CM | POA: Diagnosis not present

## 2023-11-24 DIAGNOSIS — K5900 Constipation, unspecified: Secondary | ICD-10-CM | POA: Insufficient documentation

## 2023-11-24 NOTE — Progress Notes (Signed)
 Patient here for hematology  follow-up appointment, expresses no complaints or concerns at this time.

## 2023-11-24 NOTE — Progress Notes (Signed)
 Hematology/Oncology Consult Note [facility] Telephone:([phone] [phone]   Patient Care Team: Patient, No Pcp Per as PCP - General (General Practice) Jessica Annah BROCKS, MD as Consulting Physician (Oncology)  Name of the patient: Jessica Cantu [id number] 1986-04-01  Chief Complaint: Routine follow-up history of iron deficiency without overt anemia  Referring physician: Patient, No Pcp Per  History of present illness: Patient is a 38 year old African-American female G3, P3 L3 referred for iron deficiency anemia.  Her most recent CBC on 11/27/2019 showed white count of 6.4, H&H of 9.1/33.5 with an MCV of 68.2 and a platelet count of 243.  Ferritin levels were low at 16 and iron studies showed an elevated TIBC of 515.  B12 levels were normal.  A month prior to that her H&H was 7.8/28.4.  Patient admits to having heavy menstrual cycles after the birth of her third child and she has seen Dr. Neomi for this.  She is hesitant to start any hormonal contraceptives to regulate her menstrual cycles.  She is also contemplating if she needs to go through [facility] when she goes for her dermoid cyst procedure.  She denies any consistent use of NSAIDs.  Denies any family history of colon cancer.   Interval history: Patient reports tolerating oral iron well without any significant side effects.  She has been taking 1 pill daily.  Menstrual cycles are still heavy.   ECOG:   Interval history patient reports ongoing fatigue.  She is tolerating oral iron well without any significant side effects. She notices shortness of breath, particularly exertional.Complains of lightheadedness and fatigue. Complains of leg cramps. She continues to have heavy menstrual periods. Discontinued oral iron d/t abdominal cramping and constipation. Has been increasing iron rich foods. Declines interventions with gynecology d/t concerns of side effects. She's taking zepbound for weight loss and has had good  results. Hmg with pcp was 8.5. She has not taken IV iron to date d/t fear of needles and previous poor experiences.    Review of Systems  Constitutional:  Positive for malaise/fatigue. Negative for chills, fever and weight loss.  HENT:  Negative for congestion, ear discharge and nosebleeds.   Eyes:  Negative for blurred vision.  Respiratory:  Positive for shortness of breath. Negative for cough, hemoptysis, sputum production and wheezing.   Cardiovascular:  Positive for palpitations. Negative for chest pain, orthopnea and claudication.  Gastrointestinal:  Negative for abdominal pain, blood in stool, constipation, diarrhea, heartburn, melena, nausea and vomiting.  Genitourinary:  Negative for dysuria, flank pain, frequency, hematuria and urgency.       Menorrhagia  Musculoskeletal:  Negative for back pain, falls, joint pain and myalgias.  Skin:  Negative for rash.  Neurological:  Positive for dizziness. Negative for tingling, focal weakness, seizures, weakness and headaches.  Endo/Heme/Allergies:  Does not bruise/bleed easily.  Psychiatric/Behavioral:  Negative for depression and suicidal ideas. The patient is nervous/anxious. The patient does not have insomnia.    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   12/2014: NILM, HPV 18+; 02/2016: NILM, HPV neg; 06/2016: NILM, HR HPV+, declined colpo in pregnancy   Patient Active Problem List  Diagnosis Date Noted   Post-term pregnancy, 40-42 weeks of gestation 03/03/2017   Uterine size date discrepancy, third trimester 03/03/2017   Hypertension in pregnancy 01/04/2017   Ovarian cyst complicating pregnancy, antepartum 10/14/2016    Maternal obesity, antepartum 10/13/2016   Obesity (BMI 30.0-34.9) 08/23/2016   History of thrombocytopenia 08/23/2016   History of polyhydramnios 08/23/2016   Supervision of other normal pregnancy, antepartum 08/23/2016   Pap smear abnormality of cervix/human papillomavirus (HPV) positive 08/23/2016   Prior fetal macrosomia, antepartum 08/23/2016   Glucose intolerance of pregnancy 11/28/2015   Thrombocytopenia (HCC) 09/20/2014   Past Surgical History:  Procedure Laterality Date   TONSILLECTOMY     Social History   Socioeconomic History   Marital status: Married    Spouse name: Not on file   Number of children: Not on file   Years of education: Not on file   Highest education level: Not on file  Occupational History   Not on file  Tobacco Use   Smoking status: Never   Smokeless tobacco: Never  Vaping Use   Vaping status: Never Used  Substance and Sexual Activity   Alcohol use: Yes    Comment: social   Drug use: No   Sexual activity: Yes    Partners: Male    Birth control/protection: None  Other Topics Concern   Not on file  Social History Narrative   Not on file   Social Drivers of Health   Financial Resource Strain: Not on file  Food Insecurity: Not on file  Transportation Needs: Not on file  Physical Activity: Not on file  Stress: Not on file  Social Connections: Not on file  Intimate Partner Violence: Not on file   Family History  Problem Relation Age of Onset   Stroke Mother    Hypertension Father    Diabetes Maternal Grandmother    Hypertension Maternal Grandmother    Diabetes Maternal Grandfather    Diabetes Paternal Grandmother    Diabetes Paternal Grandfather    Cancer Paternal Grandfather     Current Outpatient Medications:    ferrous sulfate 325 (65 FE) MG tablet, Take 325 mg by mouth., Disp: , Rfl:    Ergocalciferol (VITAMIN D2 PO), Take 1 capsule by mouth daily. (Patient not taking: Reported on 10/26/2022), Disp: , Rfl:    FERROUS  FUMARATE-VITAMIN C ER PO, Take 2 tablets by mouth daily. [employer] Iron Formulation, Disp: , Rfl:    ibuprofen  (ADVIL ,MOTRIN ) 600 MG tablet, Take 1 tablet (600 mg total) every 6 (six) hours by mouth. (Patient not taking: Reported on 11/24/2023), Disp: 30 tablet, Rfl: 0  No results found.  Objective:  Today's Vitals   11/24/23 0924 11/24/23 0940  BP:  119/86  Pulse:  75  Resp:  17  Temp:  (!) 97 F (36.1 C)  TempSrc:  Tympanic  SpO2:  100%  Weight:  171 lb (77.6 kg)  PainSc: 0-No pain    Body mass index is 23.85 kg/m.  Physical Exam Nursing note reviewed.  Constitutional:      Appearance: She is not ill-appearing.  HENT:     Head: Normocephalic.  Eyes:     General: No scleral icterus.    Conjunctiva/sclera: Conjunctivae normal.  Cardiovascular:     Rate and Rhythm: Normal rate and regular rhythm.     Pulses: Normal pulses.  Pulmonary:     Effort: Pulmonary effort is normal. No respiratory distress.     Breath sounds: Normal breath sounds.  Abdominal:  General: There is no distension.     Tenderness: There is no abdominal tenderness. There is no guarding.  Musculoskeletal:        General: No swelling or deformity.     Cervical back: Neck supple.  Lymphadenopathy:     Cervical: No cervical adenopathy.  Skin:    General: Skin is warm and dry.     Coloration: Skin is not pale.     Findings: No rash.  Neurological:     Mental Status: She is alert and oriented to person, place, and time.  Psychiatric:        Behavior: Behavior normal.        Thought Content: Thought content normal.        Judgment: Judgment normal.       Latest Ref Rng & Units 10/19/2022    8:29 AM  CBC  WBC 4.0 - 10.5 K/uL 6.0   Hemoglobin 12.0 - 15.0 g/dL 87.8   Hematocrit 63.9 - 46.0 % 37.6   Platelets 150 - 400 K/uL 229    Iron/TIBC/Ferritin/ %Sat    Component Value Date/Time   IRON 48 10/19/2022 0829   TIBC 428 10/19/2022 0829   FERRITIN 15 10/19/2022 0829   IRONPCTSAT 11  10/19/2022 0829   Assessment and Plan: Patient is a 38 y.o. female who returns to clinic for follow up of:   Iron Deficiency Anemia- chronic problem over past several years. She previously declined IV iron and elected for oral iron. Previously hemoglobin had been stable between 12-13 for 2+ years. However, ongoing iron deficiency with ferritin < 30. Hmg dropped to 8.5, microcytic. Ferritin 2, iron sat 4%. (Labs with pcp). Poor tolerance to oral iron. Iron stores are so depleted and hemoglobin has dropped, she's symptomatic. I would recommend IV iron. Lengthy discussion today regarding rationale, risks including possible anaphylaxis, and benefits. Cites needle phobia and previously difficulty with access. Recommend monoferric which is one dose. Pending insurance authorization and may need manufacturer assistance. May consider pre-medicating with anxiolytic. Patient in agreement. In meantime, recommend Gentle Iron, iron biglycinate 28 mg daily which is less likely to cause GI upset.  Symptomatic anemia- recommend transfusion if Hmg < 7.  Etiology of Iron Deficiency- secondary to menorrhagia. Reluctant to consider hormonal measures. She is followed by Dr Verdon & Bobbette at [facility].   Disposition:  Monoferric x 1 3 mo- lab (cbc, ferritin, iron studies) Day to week later, see me or Dr Jessica, +/- monoferric- la  I discussed the assessment and treatment plan with the patient. The patient was provided an opportunity to ask questions and all were answered. The patient agreed with the plan and demonstrated an understanding of the instructions.   The patient was advised to call back or seek an in-person evaluation if the symptoms worsen or if the condition fails to improve as anticipated.  Visit Diagnosis: 1. Iron deficiency anemia due to chronic blood loss    Tinnie Dawn, DNP, AGNP-C, [facility] at [facility] [phone] (clinic) 11/24/2023

## 2023-11-28 ENCOUNTER — Encounter: Payer: Self-pay | Admitting: Nurse Practitioner

## 2023-11-28 ENCOUNTER — Telehealth: Payer: Self-pay | Admitting: Oncology

## 2023-11-28 NOTE — Telephone Encounter (Signed)
 Per secure chat from Alfonso Cory and Tinnie Dawn, iron appt scheduled for 11/29/2023 needs to be r/s to possibly Friday. Due to insurance not having approved to pay for it yet.  it is still pending review and I could not upgrade it to urgent due to not meeting urgent criteria     I called and left vm for pt that appt has been r/s to Friday due to insurance.

## 2023-11-29 ENCOUNTER — Inpatient Hospital Stay

## 2023-12-02 ENCOUNTER — Ambulatory Visit

## 2023-12-12 NOTE — Telephone Encounter (Signed)
 Pt called and wanted an update on her iron appt.   I sent a secure chat to Alfonso Cory to see if insurance had any update.   Per Alfonso I called this morning and they are dragging their feet. it is still being reviewed. I am going to call again this afternoon since determination should be made by tomorrow  I told pt this update, she stated okay, she will wait for a call back tomorrow.

## 2023-12-23 ENCOUNTER — Telehealth: Payer: Self-pay

## 2023-12-23 DIAGNOSIS — D500 Iron deficiency anemia secondary to blood loss (chronic): Secondary | ICD-10-CM

## 2023-12-23 NOTE — Telephone Encounter (Signed)
 Per Jessica Cantu her appeal for monoferric is still pending review/ Jessica Cantu needs the denial on the appeal before she can apply for drug assistance.  If she wants to contact her insurance she can. Unfortunately, it can take up to 15 business days for the appeal to be reviewed. appeal was submitted on 8/28.  Jessica Cantu added 'Hi team, At this point, there's nothing further we can do on the pre-authorization side aside from waiting for the outcome of the appeal or a formal denial. Once that's received, Jessica Cantu can proceed with exploring drug assistance options. In the meantime, the patient may consider contacting her insurance company directly to express her concerns and frustrations. Sometimes, hearing directly from the patient can influence the insurer's decision or prompt further review. As it stands, Venofer is the preferred medication under the plan, and the payer has indicated this is their preferred option for the patient.  Jessica Cantu asked me to reach out to the patient; per Jessica Cantu I'm always happy to go with venofer but she's very needle phobic and really just wanted to take the one dose monoferric. I'm not sure how viable an option that is at this point with her insurance dragging their feet. I'm worried she's feeling poorly and that her counts are dropping. We should at least be checking her counts to ensure she isn't needing a transfusion. I can pre-medicate her with anxiolytic for the infusion as well.  Outbound call spoke to patient and informed of above.  Patient repeatedly indicating [facility] sent her a link to fill out and she hasn't heard back.  Patient was unaware an appeal process was in place.  Informed the appeal process can take approximately 15 calendar days; appeal initiated 12/15/23 and would be approximately 01/06/24 before a decision is made taking into consideration the Labor Day Holiday.  Patient would like to wait for appeal to go through but in the meantime agreed to have a lab only  visit next week.  Patient prefers appointments between 8-9 am excluding next Monday.  Forwarding message to scheduling to coordinate.

## 2024-01-05 ENCOUNTER — Telehealth: Payer: Self-pay

## 2024-01-05 NOTE — Telephone Encounter (Signed)
 Voicemail received from patient today 01/05/24 at 9:00am indicating the following: Patient states she's been trying to get an iron infusion for weeks; received letter from manufacturer indicating that they would cover $4,000 so needs to get that scheduled.  States she was also supposed to be receive a follow up regarding getting lab work done; she hasn't received a call and hasn't had success with that.  Best call back number is [phone].   Per my 9/5 telephone note: Per Alfonso Cory her appeal for monoferric is still pending review/ Vernell needs the denial on the appeal before she can apply for drug assistance.  If she wants to contact her insurance she can. Unfortunately, it can take up to 15 business days for the appeal to be reviewed. appeal was submitted on 8/28.  Per my telephone conversation that day Patient repeatedly indicating [facility] sent her a link to fill out and she hasn't heard back. Patient was unaware an appeal process was in place. Informed the appeal process can take approximately 15 calendar days; appeal initiated 12/15/23 and would be approximately 01/06/24 before a decision is made taking into consideration the Labor Day Holiday. Patient would like to wait for appeal to go through but in the meantime agreed to have a lab only visit next week. Patient prefers appointments between 8-9 am excluding next Monday. Forwarding message to scheduling to coordinate.

## 2024-01-06 ENCOUNTER — Encounter: Payer: Self-pay | Admitting: Nurse Practitioner

## 2024-01-06 NOTE — Telephone Encounter (Signed)
 Per last chat message per Olam Pinal I have sent denial info to [employer] to get assistance process started!SABRA

## 2024-01-08 ENCOUNTER — Encounter: Payer: Self-pay | Admitting: Nurse Practitioner

## 2024-01-09 ENCOUNTER — Other Ambulatory Visit: Payer: Self-pay | Admitting: Nurse Practitioner

## 2024-01-10 ENCOUNTER — Inpatient Hospital Stay

## 2024-01-10 ENCOUNTER — Inpatient Hospital Stay: Attending: Nurse Practitioner

## 2024-01-10 ENCOUNTER — Telehealth: Payer: Self-pay | Admitting: Oncology

## 2024-01-10 VITALS — BP 114/74 | HR 62 | Temp 97.3°F | Resp 18

## 2024-01-10 DIAGNOSIS — D509 Iron deficiency anemia, unspecified: Secondary | ICD-10-CM | POA: Insufficient documentation

## 2024-01-10 DIAGNOSIS — D500 Iron deficiency anemia secondary to blood loss (chronic): Secondary | ICD-10-CM

## 2024-01-10 LAB — FERRITIN: Ferritin: 8 ng/mL — ABNORMAL LOW (ref 11–307)

## 2024-01-10 LAB — IRON AND TIBC
Iron: 81 ug/dL (ref 28–170)
Saturation Ratios: 22 % (ref 10.4–31.8)
TIBC: 368 ug/dL (ref 250–450)
UIBC: 287 ug/dL

## 2024-01-10 LAB — CBC (CANCER CENTER ONLY)
HCT: 36.8 % (ref 36.0–46.0)
Hemoglobin: 11.7 g/dL — ABNORMAL LOW (ref 12.0–15.0)
MCH: 26.1 pg (ref 26.0–34.0)
MCHC: 31.8 g/dL (ref 30.0–36.0)
MCV: 82.1 fL (ref 80.0–100.0)
Platelet Count: 227 K/uL (ref 150–400)
RBC: 4.48 MIL/uL (ref 3.87–5.11)
RDW: 18.3 % — ABNORMAL HIGH (ref 11.5–15.5)
WBC Count: 4.1 K/uL (ref 4.0–10.5)
nRBC: 0 % (ref 0.0–0.2)

## 2024-01-10 MED ORDER — SODIUM CHLORIDE 0.9 % IV SOLN
INTRAVENOUS | Status: DC
  Filled 2024-01-10: qty 250

## 2024-01-10 MED ORDER — SODIUM CHLORIDE 0.9 % IV SOLN
1000.0000 mg | Freq: Once | INTRAVENOUS | Status: AC
  Administered 2024-01-10: 1000 mg via INTRAVENOUS
  Filled 2024-01-10: qty 1000

## 2024-01-10 NOTE — Telephone Encounter (Signed)
 Patient called to see if she would be charged a co pay for todays visit of lab/iron infusion. Message sent to team for drug assistance Wells [employer], Olam Pinal. [employer]cus [employer]ask[employer] and [employer]. To ask the question.    Message received back  According to the schedule the pt is coming in for labs and an infusion. Since the pt is only charged a co-pay when seeing a provider and she is not scheduled to see a provider today a co-pay is not due for the visit   Patient was informed of this message.

## 2024-01-11 ENCOUNTER — Ambulatory Visit: Payer: Self-pay | Admitting: Nurse Practitioner

## 2024-02-24 ENCOUNTER — Inpatient Hospital Stay: Attending: Nurse Practitioner

## 2024-02-24 DIAGNOSIS — D500 Iron deficiency anemia secondary to blood loss (chronic): Secondary | ICD-10-CM

## 2024-02-24 DIAGNOSIS — D509 Iron deficiency anemia, unspecified: Secondary | ICD-10-CM | POA: Insufficient documentation

## 2024-02-24 LAB — CBC (CANCER CENTER ONLY)
HCT: 41.6 % (ref 36.0–46.0)
Hemoglobin: 13.3 g/dL (ref 12.0–15.0)
MCH: 27.3 pg (ref 26.0–34.0)
MCHC: 32 g/dL (ref 30.0–36.0)
MCV: 85.4 fL (ref 80.0–100.0)
Platelet Count: 234 K/uL (ref 150–400)
RBC: 4.87 MIL/uL (ref 3.87–5.11)
RDW: 14.6 % (ref 11.5–15.5)
WBC Count: 3.5 K/uL — ABNORMAL LOW (ref 4.0–10.5)
nRBC: 0 % (ref 0.0–0.2)

## 2024-02-24 LAB — FERRITIN: Ferritin: 66 ng/mL (ref 11–307)

## 2024-02-24 LAB — IRON AND TIBC
Iron: 69 ug/dL (ref 28–170)
Saturation Ratios: 21 % (ref 10.4–31.8)
TIBC: 328 ug/dL (ref 250–450)
UIBC: 259 ug/dL

## 2024-02-27 ENCOUNTER — Telehealth: Payer: Self-pay | Admitting: Nurse Practitioner

## 2024-02-27 NOTE — Telephone Encounter (Signed)
 Patient called to ask ifshe can change tomorrows appointment to virtual due to children out of school. She said she doesn't think she will need iron and ask if she can cancel that. Please advise.  Thank you

## 2024-02-28 ENCOUNTER — Ambulatory Visit: Admitting: Nurse Practitioner

## 2024-02-28 ENCOUNTER — Encounter: Payer: Self-pay | Admitting: Nurse Practitioner

## 2024-02-28 ENCOUNTER — Inpatient Hospital Stay: Admitting: Nurse Practitioner

## 2024-02-28 ENCOUNTER — Other Ambulatory Visit: Payer: Self-pay

## 2024-02-28 ENCOUNTER — Ambulatory Visit

## 2024-02-28 DIAGNOSIS — D500 Iron deficiency anemia secondary to blood loss (chronic): Secondary | ICD-10-CM

## 2024-02-28 NOTE — Progress Notes (Signed)
 Virtual Visit Progress Note  Symptom Management Clinic  [facility] A Department of the [place]. [facility] [address] [place], [place] [address] [phone] (phone) [phone] (fax)  I connected with Jessica Cantu on 02/28/24 at 10:00 AM EST by video enabled telemedicine visit and verified that I am speaking with the correct person using two identifiers.   I discussed the limitations, risks, security and privacy concerns of performing an evaluation and management service by telemedicine and the availability of in-person appointments. I also discussed with the patient that there may be a patient responsible charge related to this service. The patient expressed understanding and agreed to proceed.   Other persons participating in the visit and their role in the encounter: none   Patient's location: home  Provider's location: clinic   Chief Complaint: Iron Deficiency Anemia    Patient Care Team: Patient, No Pcp Per as PCP - General (General Practice) [name], MD as Consulting Physician (Oncology)   Name of the patient: Jessica Cantu  [id number]  04-Nov-1985   Date of visit: 02/28/24  Diagnosis- Iron Deficiency Anemia  Chief complaint/ Reason for visit- Follow up  Heme/Onc history:  Oncology History   No history exists.    Interval history-patient is a 38 year old female with above history of iron deficiency anemia who agrees to telemedicine visit today for follow-up of her iron deficiency.  In the interim, she has received 1 dose of IV Monoferric .  Tolerated it well.  She states that her shortness of breath and exertional dyspnea has underground.  She is tolerating exercise better.  Continues to have heavy menses.  Continues to take oral iron supplements.  Review of systems- Review of Systems  Constitutional:  Negative for chills, fever, malaise/fatigue and weight loss.  HENT:  Negative for hearing  loss, nosebleeds, sore throat and tinnitus.   Eyes:  Negative for blurred vision and double vision.  Respiratory:  Negative for cough, hemoptysis, shortness of breath and wheezing.   Cardiovascular:  Negative for chest pain, palpitations and leg swelling.  Gastrointestinal:  Negative for abdominal pain, blood in stool, constipation, diarrhea, melena, nausea and vomiting.  Genitourinary:  Negative for dysuria and urgency.  Musculoskeletal:  Negative for back pain, falls, joint pain and myalgias.  Skin:  Negative for itching and rash.  Neurological:  Negative for dizziness, tingling, sensory change, loss of consciousness, weakness and headaches.  Endo/Heme/Allergies:  Negative for environmental allergies. Does not bruise/bleed easily.  Psychiatric/Behavioral:  Negative for depression. The patient is not nervous/anxious and does not have insomnia.      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/11/2015   Overview:  3.5x2.7 cm c/w dermoid on 4/24 4.41x2.91x3.4 on 10/31  -plan to follow up postpartum   Fibrocystic breast changes    Gestational thrombocytopenia    H/O gastroesophageal reflux (GERD)    Pap smear of cervix shows high risk HPV present 12/2014   12/2014: NILM, HPV 18+; 02/2016: NILM, HPV neg; 06/2016: NILM, HR HPV+, declined colpo in pregnancy    Past Surgical History:  Procedure Laterality Date   TONSILLECTOMY      Social History   Socioeconomic History   Marital status: Married    Spouse name: Not on file   Number of children: Not on file   Years of education: Not on file   Highest education level: Not on file  Occupational History  Not on file  Tobacco Use   Smoking status: Never   Smokeless tobacco: Never  Vaping Use   Vaping status: Never Used  Substance and Sexual Activity   Alcohol use: Yes    Comment: social   Drug use: No   Sexual activity: Yes    Partners: Male    Birth control/protection: None  Other  Topics Concern   Not on file  Social History Narrative   Not on file   Social Drivers of Health   Financial Resource Strain: Not on file  Food Insecurity: Not on file  Transportation Needs: Not on file  Physical Activity: Not on file  Stress: Not on file  Social Connections: Not on file  Intimate Partner Violence: Not on file    Family History  Problem Relation Age of Onset   Stroke Mother    Hypertension Father    Diabetes Maternal Grandmother    Hypertension Maternal Grandmother    Diabetes Maternal Grandfather    Diabetes Paternal Grandmother    Diabetes Paternal Grandfather    Cancer Paternal Grandfather      Current Outpatient Medications:    ferrous sulfate 325 (65 FE) MG tablet, Take 325 mg by mouth., Disp: , Rfl:    tirzepatide (ZEPBOUND) 2.5 MG/0.5ML injection vial, Inject 2.5 mg into the skin once a week., Disp: , Rfl:   Physical exam: Exam limited due to telemedicine  There were no vitals filed for this visit. Physical Exam Constitutional:      Appearance: She is not ill-appearing.  Skin:    Coloration: Skin is not pale.  Neurological:     Mental Status: She is alert and oriented to person, place, and time.        Latest Ref Rng & Units 03/03/2017   12:08 AM  CMP  Glucose 65 - 99 mg/dL 98   BUN 6 - 20 mg/dL <5   Creatinine 9.55 - 1.00 mg/dL 9.52   Sodium 864 - 854 mmol/L 137   Potassium 3.5 - 5.1 mmol/L 3.8   Chloride 101 - 111 mmol/L 104   CO2 22 - 32 mmol/L 22   Calcium 8.9 - 10.3 mg/dL 8.6   Total Protein 6.5 - 8.1 g/dL 6.8   Total Bilirubin 0.3 - 1.2 mg/dL 0.1   Alkaline Phos 38 - 126 U/L 187   AST 15 - 41 U/L 33   ALT 14 - 54 U/L 10       Latest Ref Rng & Units 02/24/2024    9:35 AM  CBC  WBC 4.0 - 10.5 K/uL 3.5   Hemoglobin 12.0 - 15.0 g/dL 86.6   Hematocrit 63.9 - 46.0 % 41.6   Platelets 150 - 400 K/uL 234       Component Value Date/Time   IRON 69 02/24/2024 0935   TIBC 328 02/24/2024 0935   FERRITIN 66 02/24/2024 0935    IRONPCTSAT 21 02/24/2024 0935   No results found.  Assessment and plan- Patient is a 38 y.o. female who returns to clinic for follow-up of   1) Iron Deficiency Anemia-chronic problem within the past several years.  She previously declined IV iron and elected for oral iron.  Previously hemoglobin had been stable between 12-13 for more than 2 years.  However, ongoing iron deficiency with ferritin less than 30.  August 2025 her hemoglobin dropped to 8.5.  Microcytic.  Ferritin 2.  Iron saturation 4%.  Poor tolerance to oral iron.  We have reviewed that her iron stores  were documented but I recommended IV iron.  She had needle phobia and previous difficulty with getting IV access.  Recommended Monoferric  which was denied by her insurance but eventually received with manufacture assistance.  She continues gentle iron and is tolerating it well.  She is status post 1 dose of IV Monoferric .  Tolerated well.  Hemoglobin is now 13.3, ferritin 66, iron saturation 21%.  Hold IV iron for now.  Okay to continue oral gentle iron. 2.  Etiology of iron deficiency-secondary to menorrhagia.  Reluctant to consider hormonal measures.  She is followed by Dr. Verdon and Bobbette at [facility].  Continue follow-up.  Disposition:  6 mo- labs (cbc w/ diff), cmp, ferritin, iron studies) Day to week later- see Dr Melanee virtually for f/u- la  Visit Diagnosis 1. Iron deficiency anemia due to chronic blood loss    Patient expressed understanding and was in agreement with this plan. She also understands that She can call clinic at any time with any questions, concerns, or complaints.   I discussed the assessment and treatment plan with the patient. The patient was provided an opportunity to ask questions and all were answered. The patient agreed with the plan and demonstrated an understanding of the instructions.   The patient was advised to call back or seek an in-person evaluation if the symptoms worsen or if the  condition fails to improve as anticipated.   I spent 15 minutes face-to-face video visit time dedicated to the care of this patient on the date of this encounter to include pre-visit review of previous note, labs, face-to-face time with the patient, and post visit ordering of testing/documentation.   Thank you for allowing me to participate in the care of this very pleasant patient.   Tinnie Dawn, DNP, AGNP-C [facility]

## 2024-03-20 ENCOUNTER — Telehealth: Payer: Self-pay

## 2024-03-20 NOTE — Telephone Encounter (Signed)
 Patient called requesting a follow up on a bill that she is receiving for an iron infusion she received.  She has pt assistance approval for the iron and the approval letter is scanned in her chart.  She has been told by billing dept that they do not have record of pt asst.  Forwarding message to insurance auth dept for review.

## 2024-03-20 NOTE — Telephone Encounter (Signed)
 I communicated with Vernell SAUNDERS, CPhT regarding patient assistance.  Vernell explained to me that the pt asst was for copay assistance and the insurance had to be filed for infusion.  I spoke to patient but she had more detailed questions to answers I could not provide.  Explained to the pt that I work in the clinical side of the clinic and do not have access to the insurance/billing portion of her chart.  Contact number for Vernell was provided to patient.

## 2024-05-16 ENCOUNTER — Encounter: Payer: Self-pay | Admitting: Nurse Practitioner

## 2024-08-24 ENCOUNTER — Inpatient Hospital Stay

## 2024-08-29 ENCOUNTER — Inpatient Hospital Stay: Admitting: Oncology
